# Patient Record
Sex: Female | Born: 1940 | Race: White | Hispanic: No | Smoking: Former smoker
Health system: Southern US, Community
[De-identification: ages and names within clinical notes are randomized; demographics above are authoritative.]

## PROBLEM LIST (undated history)

## (undated) DIAGNOSIS — E785 Hyperlipidemia, unspecified: Secondary | ICD-10-CM

## (undated) DIAGNOSIS — J449 Chronic obstructive pulmonary disease, unspecified: Secondary | ICD-10-CM

## (undated) DIAGNOSIS — I509 Heart failure, unspecified: Secondary | ICD-10-CM

## (undated) DIAGNOSIS — I10 Essential (primary) hypertension: Secondary | ICD-10-CM

## (undated) DIAGNOSIS — I219 Acute myocardial infarction, unspecified: Secondary | ICD-10-CM

## (undated) HISTORY — DX: Acute myocardial infarction, unspecified: I21.9

## (undated) HISTORY — PX: CHOLECYSTECTOMY: SHX55

## (undated) HISTORY — DX: Chronic obstructive pulmonary disease, unspecified: J44.9

## (undated) HISTORY — DX: Hyperlipidemia, unspecified: E78.5

## (undated) HISTORY — DX: Heart failure, unspecified: I50.9

## (undated) HISTORY — PX: EYE SURGERY: SHX253

## (undated) HISTORY — PX: ABDOMINAL HYSTERECTOMY: SHX81

## (undated) HISTORY — DX: Essential (primary) hypertension: I10

## (undated) HISTORY — PX: APPENDECTOMY: SHX54

## (undated) HISTORY — PX: CORONARY ARTERY BYPASS GRAFT: SHX141

---

## 2008-01-15 DIAGNOSIS — E785 Hyperlipidemia, unspecified: Secondary | ICD-10-CM | POA: Insufficient documentation

## 2008-01-15 DIAGNOSIS — I2510 Atherosclerotic heart disease of native coronary artery without angina pectoris: Secondary | ICD-10-CM | POA: Insufficient documentation

## 2012-01-10 DIAGNOSIS — Z955 Presence of coronary angioplasty implant and graft: Secondary | ICD-10-CM | POA: Insufficient documentation

## 2013-07-24 DIAGNOSIS — S61209A Unspecified open wound of unspecified finger without damage to nail, initial encounter: Secondary | ICD-10-CM | POA: Diagnosis not present

## 2013-07-26 DIAGNOSIS — E785 Hyperlipidemia, unspecified: Secondary | ICD-10-CM | POA: Diagnosis not present

## 2013-07-26 DIAGNOSIS — I252 Old myocardial infarction: Secondary | ICD-10-CM | POA: Diagnosis not present

## 2013-07-26 DIAGNOSIS — I509 Heart failure, unspecified: Secondary | ICD-10-CM | POA: Diagnosis not present

## 2013-07-26 DIAGNOSIS — I7090 Unspecified atherosclerosis: Secondary | ICD-10-CM | POA: Diagnosis not present

## 2013-07-26 DIAGNOSIS — I2510 Atherosclerotic heart disease of native coronary artery without angina pectoris: Secondary | ICD-10-CM | POA: Diagnosis not present

## 2013-07-26 DIAGNOSIS — I10 Essential (primary) hypertension: Secondary | ICD-10-CM | POA: Diagnosis not present

## 2013-07-26 DIAGNOSIS — N183 Chronic kidney disease, stage 3 unspecified: Secondary | ICD-10-CM | POA: Diagnosis not present

## 2013-07-26 DIAGNOSIS — J449 Chronic obstructive pulmonary disease, unspecified: Secondary | ICD-10-CM | POA: Diagnosis not present

## 2013-08-05 DIAGNOSIS — J329 Chronic sinusitis, unspecified: Secondary | ICD-10-CM | POA: Diagnosis not present

## 2013-08-05 DIAGNOSIS — J40 Bronchitis, not specified as acute or chronic: Secondary | ICD-10-CM | POA: Diagnosis not present

## 2013-08-05 DIAGNOSIS — H6120 Impacted cerumen, unspecified ear: Secondary | ICD-10-CM | POA: Diagnosis not present

## 2013-08-05 DIAGNOSIS — Z4802 Encounter for removal of sutures: Secondary | ICD-10-CM | POA: Diagnosis not present

## 2013-09-24 DIAGNOSIS — I509 Heart failure, unspecified: Secondary | ICD-10-CM | POA: Diagnosis not present

## 2013-09-24 DIAGNOSIS — E785 Hyperlipidemia, unspecified: Secondary | ICD-10-CM | POA: Diagnosis not present

## 2013-09-24 DIAGNOSIS — I129 Hypertensive chronic kidney disease with stage 1 through stage 4 chronic kidney disease, or unspecified chronic kidney disease: Secondary | ICD-10-CM | POA: Diagnosis not present

## 2013-09-24 DIAGNOSIS — J449 Chronic obstructive pulmonary disease, unspecified: Secondary | ICD-10-CM | POA: Diagnosis not present

## 2013-09-24 DIAGNOSIS — N183 Chronic kidney disease, stage 3 unspecified: Secondary | ICD-10-CM | POA: Diagnosis not present

## 2013-12-06 DIAGNOSIS — E785 Hyperlipidemia, unspecified: Secondary | ICD-10-CM | POA: Diagnosis not present

## 2013-12-18 DIAGNOSIS — Z1231 Encounter for screening mammogram for malignant neoplasm of breast: Secondary | ICD-10-CM | POA: Diagnosis not present

## 2013-12-24 DIAGNOSIS — G8929 Other chronic pain: Secondary | ICD-10-CM | POA: Diagnosis not present

## 2013-12-24 DIAGNOSIS — I10 Essential (primary) hypertension: Secondary | ICD-10-CM | POA: Diagnosis not present

## 2013-12-24 DIAGNOSIS — E7210 Disorders of sulfur-bearing amino-acid metabolism, unspecified: Secondary | ICD-10-CM | POA: Diagnosis not present

## 2013-12-24 DIAGNOSIS — E785 Hyperlipidemia, unspecified: Secondary | ICD-10-CM | POA: Diagnosis not present

## 2013-12-24 DIAGNOSIS — I509 Heart failure, unspecified: Secondary | ICD-10-CM | POA: Diagnosis not present

## 2013-12-24 DIAGNOSIS — M549 Dorsalgia, unspecified: Secondary | ICD-10-CM | POA: Diagnosis not present

## 2013-12-25 DIAGNOSIS — I2510 Atherosclerotic heart disease of native coronary artery without angina pectoris: Secondary | ICD-10-CM | POA: Diagnosis not present

## 2013-12-25 DIAGNOSIS — N183 Chronic kidney disease, stage 3 unspecified: Secondary | ICD-10-CM | POA: Diagnosis not present

## 2013-12-25 DIAGNOSIS — J449 Chronic obstructive pulmonary disease, unspecified: Secondary | ICD-10-CM | POA: Diagnosis not present

## 2013-12-25 DIAGNOSIS — E7210 Disorders of sulfur-bearing amino-acid metabolism, unspecified: Secondary | ICD-10-CM | POA: Diagnosis not present

## 2013-12-25 DIAGNOSIS — E785 Hyperlipidemia, unspecified: Secondary | ICD-10-CM | POA: Diagnosis not present

## 2013-12-30 DIAGNOSIS — R928 Other abnormal and inconclusive findings on diagnostic imaging of breast: Secondary | ICD-10-CM | POA: Diagnosis not present

## 2014-01-07 DIAGNOSIS — R920 Mammographic microcalcification found on diagnostic imaging of breast: Secondary | ICD-10-CM | POA: Diagnosis not present

## 2014-01-07 DIAGNOSIS — D249 Benign neoplasm of unspecified breast: Secondary | ICD-10-CM | POA: Diagnosis not present

## 2014-02-18 DIAGNOSIS — Z951 Presence of aortocoronary bypass graft: Secondary | ICD-10-CM | POA: Diagnosis not present

## 2014-02-18 DIAGNOSIS — I2510 Atherosclerotic heart disease of native coronary artery without angina pectoris: Secondary | ICD-10-CM | POA: Diagnosis not present

## 2014-02-18 DIAGNOSIS — E785 Hyperlipidemia, unspecified: Secondary | ICD-10-CM | POA: Diagnosis not present

## 2014-02-18 DIAGNOSIS — E789 Disorder of lipoprotein metabolism, unspecified: Secondary | ICD-10-CM | POA: Diagnosis not present

## 2014-02-18 DIAGNOSIS — I259 Chronic ischemic heart disease, unspecified: Secondary | ICD-10-CM | POA: Diagnosis not present

## 2014-02-18 DIAGNOSIS — Z9861 Coronary angioplasty status: Secondary | ICD-10-CM | POA: Diagnosis not present

## 2014-03-05 DIAGNOSIS — J3089 Other allergic rhinitis: Secondary | ICD-10-CM | POA: Diagnosis not present

## 2014-06-30 DIAGNOSIS — E785 Hyperlipidemia, unspecified: Secondary | ICD-10-CM | POA: Diagnosis not present

## 2014-06-30 DIAGNOSIS — I252 Old myocardial infarction: Secondary | ICD-10-CM | POA: Diagnosis not present

## 2014-06-30 DIAGNOSIS — I10 Essential (primary) hypertension: Secondary | ICD-10-CM | POA: Diagnosis not present

## 2014-06-30 DIAGNOSIS — I509 Heart failure, unspecified: Secondary | ICD-10-CM | POA: Diagnosis not present

## 2014-12-01 DIAGNOSIS — E780 Pure hypercholesterolemia: Secondary | ICD-10-CM | POA: Diagnosis not present

## 2014-12-01 DIAGNOSIS — S51851A Open bite of right forearm, initial encounter: Secondary | ICD-10-CM | POA: Diagnosis not present

## 2014-12-01 DIAGNOSIS — W540XXA Bitten by dog, initial encounter: Secondary | ICD-10-CM | POA: Diagnosis not present

## 2014-12-01 DIAGNOSIS — I10 Essential (primary) hypertension: Secondary | ICD-10-CM | POA: Diagnosis not present

## 2014-12-01 DIAGNOSIS — M8580 Other specified disorders of bone density and structure, unspecified site: Secondary | ICD-10-CM | POA: Diagnosis not present

## 2014-12-01 DIAGNOSIS — S61501A Unspecified open wound of right wrist, initial encounter: Secondary | ICD-10-CM | POA: Diagnosis not present

## 2014-12-01 DIAGNOSIS — I2510 Atherosclerotic heart disease of native coronary artery without angina pectoris: Secondary | ICD-10-CM | POA: Diagnosis not present

## 2014-12-05 DIAGNOSIS — R609 Edema, unspecified: Secondary | ICD-10-CM | POA: Diagnosis not present

## 2014-12-05 DIAGNOSIS — K30 Functional dyspepsia: Secondary | ICD-10-CM | POA: Diagnosis not present

## 2014-12-05 DIAGNOSIS — E785 Hyperlipidemia, unspecified: Secondary | ICD-10-CM | POA: Diagnosis not present

## 2014-12-05 DIAGNOSIS — M1000 Idiopathic gout, unspecified site: Secondary | ICD-10-CM | POA: Diagnosis not present

## 2014-12-05 DIAGNOSIS — I2510 Atherosclerotic heart disease of native coronary artery without angina pectoris: Secondary | ICD-10-CM | POA: Diagnosis not present

## 2015-02-19 ENCOUNTER — Other Ambulatory Visit: Payer: Self-pay | Admitting: *Deleted

## 2015-02-19 MED ORDER — POTASSIUM CHLORIDE ER 20 MEQ PO TBCR: 20.0000 meq | EXTENDED_RELEASE_TABLET | Freq: Two times a day (BID) | ORAL | Status: DC

## 2015-02-24 DIAGNOSIS — R0789 Other chest pain: Secondary | ICD-10-CM | POA: Diagnosis not present

## 2015-02-24 DIAGNOSIS — I5030 Unspecified diastolic (congestive) heart failure: Secondary | ICD-10-CM | POA: Diagnosis not present

## 2015-02-24 DIAGNOSIS — Z951 Presence of aortocoronary bypass graft: Secondary | ICD-10-CM | POA: Diagnosis not present

## 2015-02-24 DIAGNOSIS — R0602 Shortness of breath: Secondary | ICD-10-CM | POA: Diagnosis not present

## 2015-02-24 DIAGNOSIS — Z7982 Long term (current) use of aspirin: Secondary | ICD-10-CM | POA: Diagnosis not present

## 2015-02-24 DIAGNOSIS — Z79899 Other long term (current) drug therapy: Secondary | ICD-10-CM | POA: Diagnosis not present

## 2015-02-24 DIAGNOSIS — R079 Chest pain, unspecified: Secondary | ICD-10-CM | POA: Diagnosis not present

## 2015-02-24 DIAGNOSIS — I259 Chronic ischemic heart disease, unspecified: Secondary | ICD-10-CM | POA: Diagnosis not present

## 2015-02-24 DIAGNOSIS — I2510 Atherosclerotic heart disease of native coronary artery without angina pectoris: Secondary | ICD-10-CM | POA: Diagnosis not present

## 2015-02-24 DIAGNOSIS — E785 Hyperlipidemia, unspecified: Secondary | ICD-10-CM | POA: Diagnosis not present

## 2015-02-24 DIAGNOSIS — Z955 Presence of coronary angioplasty implant and graft: Secondary | ICD-10-CM | POA: Diagnosis not present

## 2015-02-24 DIAGNOSIS — I749 Embolism and thrombosis of unspecified artery: Secondary | ICD-10-CM | POA: Diagnosis not present

## 2015-03-04 DIAGNOSIS — I272 Other secondary pulmonary hypertension: Secondary | ICD-10-CM | POA: Diagnosis not present

## 2015-03-04 DIAGNOSIS — I348 Other nonrheumatic mitral valve disorders: Secondary | ICD-10-CM | POA: Diagnosis not present

## 2015-03-04 DIAGNOSIS — I259 Chronic ischemic heart disease, unspecified: Secondary | ICD-10-CM | POA: Diagnosis not present

## 2015-03-04 DIAGNOSIS — R0602 Shortness of breath: Secondary | ICD-10-CM | POA: Diagnosis not present

## 2015-03-04 DIAGNOSIS — I350 Nonrheumatic aortic (valve) stenosis: Secondary | ICD-10-CM | POA: Diagnosis not present

## 2015-03-04 DIAGNOSIS — I082 Rheumatic disorders of both aortic and tricuspid valves: Secondary | ICD-10-CM | POA: Diagnosis not present

## 2015-03-04 DIAGNOSIS — Z955 Presence of coronary angioplasty implant and graft: Secondary | ICD-10-CM | POA: Diagnosis not present

## 2015-03-04 DIAGNOSIS — I517 Cardiomegaly: Secondary | ICD-10-CM | POA: Diagnosis not present

## 2015-03-04 DIAGNOSIS — R079 Chest pain, unspecified: Secondary | ICD-10-CM | POA: Diagnosis not present

## 2015-03-04 DIAGNOSIS — I361 Nonrheumatic tricuspid (valve) insufficiency: Secondary | ICD-10-CM | POA: Diagnosis not present

## 2015-03-04 DIAGNOSIS — Z951 Presence of aortocoronary bypass graft: Secondary | ICD-10-CM | POA: Diagnosis not present

## 2015-03-12 DIAGNOSIS — Z951 Presence of aortocoronary bypass graft: Secondary | ICD-10-CM | POA: Diagnosis not present

## 2015-03-12 DIAGNOSIS — I2510 Atherosclerotic heart disease of native coronary artery without angina pectoris: Secondary | ICD-10-CM | POA: Diagnosis not present

## 2015-03-12 DIAGNOSIS — Z955 Presence of coronary angioplasty implant and graft: Secondary | ICD-10-CM | POA: Diagnosis not present

## 2015-03-12 DIAGNOSIS — I259 Chronic ischemic heart disease, unspecified: Secondary | ICD-10-CM | POA: Diagnosis not present

## 2015-03-12 DIAGNOSIS — Z882 Allergy status to sulfonamides status: Secondary | ICD-10-CM | POA: Diagnosis not present

## 2015-03-12 DIAGNOSIS — Z880 Allergy status to penicillin: Secondary | ICD-10-CM | POA: Diagnosis not present

## 2015-03-12 DIAGNOSIS — E780 Pure hypercholesterolemia: Secondary | ICD-10-CM | POA: Diagnosis not present

## 2015-03-12 DIAGNOSIS — Z79899 Other long term (current) drug therapy: Secondary | ICD-10-CM | POA: Diagnosis not present

## 2015-03-12 DIAGNOSIS — I25118 Atherosclerotic heart disease of native coronary artery with other forms of angina pectoris: Secondary | ICD-10-CM | POA: Diagnosis not present

## 2015-03-20 ENCOUNTER — Ambulatory Visit (INDEPENDENT_AMBULATORY_CARE_PROVIDER_SITE_OTHER): Payer: Medicare Other | Admitting: Family Medicine

## 2015-03-20 ENCOUNTER — Other Ambulatory Visit: Payer: Self-pay | Admitting: Family Medicine

## 2015-03-20 ENCOUNTER — Encounter (INDEPENDENT_AMBULATORY_CARE_PROVIDER_SITE_OTHER): Payer: Self-pay

## 2015-03-20 ENCOUNTER — Ambulatory Visit (INDEPENDENT_AMBULATORY_CARE_PROVIDER_SITE_OTHER): Payer: Medicare Other

## 2015-03-20 ENCOUNTER — Encounter: Payer: Self-pay | Admitting: Family Medicine

## 2015-03-20 VITALS — BP 129/70 | HR 70 | Temp 97.1°F | Ht 61.0 in | Wt 224.6 lb

## 2015-03-20 DIAGNOSIS — R0602 Shortness of breath: Secondary | ICD-10-CM

## 2015-03-20 DIAGNOSIS — R0600 Dyspnea, unspecified: Secondary | ICD-10-CM | POA: Diagnosis not present

## 2015-03-20 DIAGNOSIS — I420 Dilated cardiomyopathy: Secondary | ICD-10-CM | POA: Diagnosis not present

## 2015-03-20 DIAGNOSIS — I209 Angina pectoris, unspecified: Secondary | ICD-10-CM | POA: Diagnosis not present

## 2015-03-20 DIAGNOSIS — J449 Chronic obstructive pulmonary disease, unspecified: Secondary | ICD-10-CM

## 2015-03-20 NOTE — Progress Notes (Addendum)
Subjective:  Patient ID: Alexis Powell, female    DOB: 1941-03-01  Age: 74 y.o. MRN: [id number]  CC: Hypertension; Hyperlipidemia; and Coronary Artery Disease   HPI [name] presents for dyspnea on exertion. Pulse ox readings at home drop as low as 77 with a single flight of stairs. She has been evaluated by cardiology and brings in an echocardiogram report showing aortic sclerosis and mild pulmonary insufficiency but no aortic stenosis or other significant valvular problem. Her ejection fraction is 50-55 percent. Additionally she had a cardiac catheterization that report was received as well. 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  Subsequently she began recording her pulse ox reading multiple times daily. That log was brought in and is attached. It shows that at rest her pulse ox tends to stay in the low 90s. However if she stirs such as to take a shower or walk around the house she quickly drops into the mid 80s. If she tries to climb a flight of stairs or something similar strenuous she will have a pulse ox dropped into the mid 70s. She does not have any significant edema. However after her cath last week she was given an IV dose of Lasix and dropped 9 pounds overnight. That has started to return she is now down 5 pounds from her baseline. However, she denies edema. She does not think this is her heart failure. However she states that since her dyspnea is only with exertion that it is not COPD. She feels that a lot of this started when her angina medicine, isosorbide was changed from one generic to another. She takes only brand name Coreg because of problems with the generic carvedilol and she only takes branded Altace due to problems with the generic ramipril. History Elantra has a past medical history of Hyperlipidemia; Hypertension; and Myocardial infarction.   She has past surgical history that includes Coronary artery bypass graft;  Abdominal hysterectomy; Cesarean section; Appendectomy; and Cholecystectomy.   Her family history includes Heart disease in her father.She reports that she quit smoking about 16 years ago. She does not have any smokeless tobacco history on file. She reports that she does not drink alcohol or use illicit drugs.  Outpatient Prescriptions Prior to Visit  Medication Sig Dispense Refill  . potassium chloride 20 MEQ TBCR Take 20 mEq by mouth 2 (two) times daily. 60 tablet 1   No facility-administered medications prior to visit.    ROS Review of Systems  Constitutional: Negative for fever, chills, diaphoresis, appetite change, fatigue and unexpected weight change.  HENT: Negative for congestion, ear pain, hearing loss, postnasal drip, rhinorrhea, sneezing, sore throat and trouble swallowing.   Eyes: Negative for pain.  Respiratory: Positive for cough, chest tightness and shortness of breath. Negative for wheezing.   Cardiovascular: Negative for chest pain, palpitations and leg swelling.  Gastrointestinal: Negative for nausea, vomiting, abdominal pain, diarrhea and constipation.  Genitourinary: Negative for dysuria, frequency and menstrual problem.  Musculoskeletal: Negative for joint swelling and arthralgias.  Skin: Negative for rash.  Neurological: Negative for dizziness, weakness, numbness and headaches.  Psychiatric/Behavioral: Negative for dysphoric mood and agitation.    Objective:  BP 129/70 mmHg  Pulse 70  Temp(Src) 97.1 F (36.2 C) (Oral)  Ht 5' 1"  (1.549 m)  Wt 224 lb 9.6 oz (101.878 kg)  BMI 42.46 kg/m2  SpO2 86%  BP Readings from Last 3 Encounters:  03/20/15 129/70    Wt Readings  from Last 3 Encounters:  03/20/15 224 lb 9.6 oz (101.878 kg)     Physical Exam  Constitutional: She is oriented to person, place, and time. She appears well-developed and well-nourished. No distress.  HENT:  Head: Normocephalic and atraumatic.  Right Ear: External ear normal.  Left Ear:  External ear normal.  Nose: Nose normal.  Mouth/Throat: Oropharynx is clear and moist.  Eyes: Conjunctivae and EOM are normal. Pupils are equal, round, and reactive to light.  Neck: Normal range of motion. Neck supple. No thyromegaly present.  Cardiovascular: Normal rate, regular rhythm and normal heart sounds.   No murmur heard. Pulmonary/Chest: She is in respiratory distress. She has wheezes. She has no rales.  Pulse ox 86 at rest. Down into 70s with ambulation  Abdominal: Soft. Bowel sounds are normal. She exhibits no distension. There is no tenderness.  Lymphadenopathy:    She has no cervical adenopathy.  Neurological: She is alert and oriented to person, place, and time. She has normal reflexes.  Skin: Skin is warm and dry.  Psychiatric: She has a normal mood and affect. Her behavior is normal. Judgment and thought content normal.    No results found for: HGBA1C  No results found for: WBC, HGB, HCT, PLT, GLUCOSE, CHOL, TRIG, HDL, LDLDIRECT, LDLCALC, ALT, AST, NA, K, CL, CREATININE, BUN, CO2, TSH, PSA, INR, GLUF, HGBA1C, MICROALBUR  Patient was never admitted.  Assessment & Plan:   Myya was seen today for hypertension, hyperlipidemia and coronary artery disease.  Diagnoses and all orders for this visit:  Congestive cardiomyopathy -     CBC with Differential/Platelet -     Pro b natriuretic peptide -     D-dimer, quantitative (not at [facility]) -     CMP14+EGFR -     Ambulatory referral to [place] pectoris -     CBC with Differential/Platelet -     Pro b natriuretic peptide -     D-dimer, quantitative (not at [facility]) -     CMP14+EGFR -     Ambulatory referral to [place]  COPD mixed type -     CBC with Differential/Platelet -     Pro b natriuretic peptide -     D-dimer, quantitative (not at [facility]) -     CMP14+EGFR -     Ambulatory referral to [place]  Dyspnea -     CBC with Differential/Platelet -     Pro b natriuretic peptide -     D-dimer,  quantitative (not at [facility]) -     CMP14+EGFR -     Ambulatory referral to [place]   I have discontinued Ms. Altieri's Potassium Chloride ER. I am also having her maintain her aspirin, atorvastatin, carvedilol, carvedilol, famotidine, furosemide, isosorbide mononitrate, ramipril, and potassium chloride.  Meds ordered this encounter  Medications  . aspirin 81 MG chewable tablet    Sig: Chew 81 mg by mouth daily.  Marland Kitchen atorvastatin (LIPITOR) 10 MG tablet    Sig: Take 10 mg by mouth daily.  . carvedilol (COREG) 12.5 MG tablet    Sig: Take 12.5 mg by mouth 2 (two) times daily.  . carvedilol (COREG) 6.25 MG tablet    Sig: Take 6.25 mg by mouth every morning.  . famotidine (PEPCID) 20 MG tablet    Sig: Take 20 mg by mouth daily.  . furosemide (LASIX) 40 MG tablet    Sig: Take 40 mg by mouth 2 (two) times daily.  . isosorbide mononitrate (IMDUR) 30  MG 24 hr tablet    Sig: Take 30 mg by mouth daily.  . ramipril (ALTACE) 10 MG capsule    Sig: Take 10 mg by mouth daily.  . potassium chloride (KLOR-CON) 20 MEQ packet    Sig: Take 20 mEq by mouth 2 (two) times daily.   Initial chest x-ray reading showed no acute cardiopulmonary disease. There was some hyperexpansion noted. This by Dr. Robb Matar Stacks. Subsequent reading by radiology added to that elevation of the right hemidiaphragm.  Pulmonary function testing showed severe restrictive disease. No obstruction noted.  Patient could not tolerate respiratory stress test today due to low pulse ox of 86 after performing the resting pulmonary function test.  Follow-up: Return in about 1 week (around 03/27/2015).  Claretta Fraise, M.D.

## 2015-03-21 LAB — CBC WITH DIFFERENTIAL/PLATELET
BASOS: 0 %
Basophils Absolute: 0 10*3/uL (ref 0.0–0.2)
EOS (ABSOLUTE): 0.1 10*3/uL (ref 0.0–0.4)
Eos: 2 %
Hematocrit: 48.4 % — ABNORMAL HIGH (ref 34.0–46.6)
Hemoglobin: 16 g/dL — ABNORMAL HIGH (ref 11.1–15.9)
IMMATURE GRANS (ABS): 0 10*3/uL (ref 0.0–0.1)
Immature Granulocytes: 0 %
LYMPHS ABS: 1.5 10*3/uL (ref 0.7–3.1)
Lymphs: 24 %
MCH: 31.9 pg (ref 26.6–33.0)
MCHC: 33.1 g/dL (ref 31.5–35.7)
MCV: 97 fL (ref 79–97)
MONOS ABS: 0.9 10*3/uL (ref 0.1–0.9)
Monocytes: 14 %
NEUTROS ABS: 3.7 10*3/uL (ref 1.4–7.0)
Neutrophils: 60 %
PLATELETS: 160 10*3/uL (ref 150–379)
RBC: 5.01 x10E6/uL (ref 3.77–5.28)
RDW: 13.4 % (ref 12.3–15.4)
WBC: 6.2 10*3/uL (ref 3.4–10.8)

## 2015-03-21 LAB — CMP14+EGFR
ALBUMIN: 4.2 g/dL (ref 3.5–4.8)
ALK PHOS: 82 IU/L (ref 39–117)
ALT: 12 IU/L (ref 0–32)
AST: 17 IU/L (ref 0–40)
Albumin/Globulin Ratio: 1.8 (ref 1.1–2.5)
BUN / CREAT RATIO: 16 (ref 11–26)
BUN: 14 mg/dL (ref 8–27)
Bilirubin Total: 0.5 mg/dL (ref 0.0–1.2)
CO2: 27 mmol/L (ref 18–29)
CREATININE: 0.86 mg/dL (ref 0.57–1.00)
Calcium: 9 mg/dL (ref 8.7–10.3)
Chloride: 98 mmol/L (ref 97–108)
GFR calc Af Amer: 76 mL/min/{1.73_m2} (ref >59)
GFR calc non Af Amer: 66 mL/min/{1.73_m2} (ref >59)
GLOBULIN, TOTAL: 2.4 g/dL (ref 1.5–4.5)
Glucose: 102 mg/dL — ABNORMAL HIGH (ref 65–99)
Potassium: 4.2 mmol/L (ref 3.5–5.2)
SODIUM: 145 mmol/L — AB (ref 134–144)
Total Protein: 6.6 g/dL (ref 6.0–8.5)

## 2015-03-21 LAB — PRO B NATRIURETIC PEPTIDE: NT-Pro BNP: 343 pg/mL (ref 0–738)

## 2015-03-21 LAB — D-DIMER, QUANTITATIVE: D-DIMER: 0.74 mg/L FEU — ABNORMAL HIGH (ref 0.00–0.49)

## 2015-03-25 ENCOUNTER — Encounter: Payer: Self-pay | Admitting: Family Medicine

## 2015-03-25 LAB — PULMONARY FUNCTION TEST

## 2015-03-27 ENCOUNTER — Telehealth: Payer: Self-pay | Admitting: Family Medicine

## 2015-03-27 ENCOUNTER — Encounter: Payer: Self-pay | Admitting: Family Medicine

## 2015-03-27 ENCOUNTER — Ambulatory Visit (INDEPENDENT_AMBULATORY_CARE_PROVIDER_SITE_OTHER): Payer: Medicare Other | Admitting: Family Medicine

## 2015-03-27 VITALS — BP 117/68 | HR 69 | Temp 97.2°F | Ht 61.0 in | Wt 223.8 lb

## 2015-03-27 DIAGNOSIS — I209 Angina pectoris, unspecified: Secondary | ICD-10-CM | POA: Diagnosis not present

## 2015-03-27 DIAGNOSIS — R0602 Shortness of breath: Secondary | ICD-10-CM | POA: Diagnosis not present

## 2015-03-27 MED ORDER — ISOSORBIDE MONONITRATE ER 30 MG PO TB24: 30.0000 mg | ORAL_TABLET | Freq: Every day | ORAL | Status: DC

## 2015-03-27 NOTE — Progress Notes (Signed)
Subjective:  Patient ID: Alexis Powell, female    DOB: 05/17/1941  Age: 74 y.o. MRN: [id number]  CC: Coronary Artery Disease and Congestive Heart Failure   HPI [name] presents for patient continues to feel weak. She is also short of breath. She has some edema of the legs. She is taking Lasix twice a day.. She can ambulate for activities of daily living in the home. However she is unable to perform any more strenuous activities. She becomes too short of breath. Her pulse ox will drop into the 70s or low 80s. She checks it regularly at home. She would like to have oxygen for home use based on the low pulse ox. She has not yet had her CTPA. That is to be done based on the mild elevation of her d-dimer at last visit 1 week ago.  History Deondrea has a past medical history of Hyperlipidemia; Hypertension; and Myocardial infarction.   She has past surgical history that includes Coronary artery bypass graft; Abdominal hysterectomy; Cesarean section; Appendectomy; and Cholecystectomy.   Her family history includes Heart disease in her father.She reports that she quit smoking about 16 years ago. She does not have any smokeless tobacco history on file. She reports that she does not drink alcohol or use illicit drugs.  Outpatient Prescriptions Prior to Visit  Medication Sig Dispense Refill  . aspirin 81 MG chewable tablet Chew 81 mg by mouth daily.    Marland Kitchen atorvastatin (LIPITOR) 10 MG tablet Take 10 mg by mouth daily.    . carvedilol (COREG) 6.25 MG tablet Take 6.25 mg by mouth every morning.    . famotidine (PEPCID) 20 MG tablet Take 20 mg by mouth daily.    . furosemide (LASIX) 40 MG tablet Take 40 mg by mouth 2 (two) times daily.    . potassium chloride (KLOR-CON) 20 MEQ packet Take 20 mEq by mouth 2 (two) times daily.    . ramipril (ALTACE) 10 MG capsule Take 10 mg by mouth daily.    . isosorbide mononitrate (IMDUR) 30 MG 24 hr tablet Take 30 mg by mouth daily.    . carvedilol (COREG) 12.5 MG  tablet Take 12.5 mg by mouth every evening.     No facility-administered medications prior to visit.    ROS Review of Systems  Constitutional: Negative for fever, chills, diaphoresis, appetite change, fatigue and unexpected weight change.  HENT: Negative for congestion, ear pain, hearing loss, postnasal drip, rhinorrhea, sneezing, sore throat and trouble swallowing.   Eyes: Negative for pain.  Respiratory: Positive for chest tightness and shortness of breath.   Cardiovascular: Negative for chest pain and palpitations.       She notes that her pulse is been in the low 50s.  Gastrointestinal: Negative for nausea, vomiting, abdominal pain, diarrhea and constipation.  Genitourinary: Negative for dysuria, frequency and menstrual problem.  Musculoskeletal: Negative for joint swelling and arthralgias.  Skin: Negative for rash.  Neurological: Negative for dizziness, weakness, numbness and headaches.  Psychiatric/Behavioral: Negative for agitation. The patient is nervous/anxious.     Objective:  BP 117/68 mmHg  Pulse 69  Temp(Src) 97.2 F (36.2 C) (Oral)  Ht 5\' 1"  (1.549 m)  Wt 223 lb 12.8 oz (101.515 kg)  BMI 42.31 kg/m2  BP Readings from Last 3 Encounters:  03/27/15 117/68  03/20/15 129/70    Wt Readings from Last 3 Encounters:  03/27/15 223 lb 12.8 oz (101.515 kg)  03/20/15 224 lb 9.6 oz (101.878 kg)     Physical  Exam  Constitutional: She is oriented to person, place, and time. She appears well-developed and well-nourished. She appears distressed.  HENT:  Head: Normocephalic and atraumatic.  Eyes: Conjunctivae are normal. Pupils are equal, round, and reactive to light.  Neck: Normal range of motion. Neck supple. No thyromegaly present.  Cardiovascular: Normal rate, regular rhythm and normal heart sounds.   No murmur heard. Pulmonary/Chest: She is in respiratory distress. She has wheezes. She has no rales. She exhibits no tenderness.  Abdominal: Soft. Bowel sounds are  normal. She exhibits no distension. There is no tenderness.  Musculoskeletal: Normal range of motion.  Lymphadenopathy:    She has no cervical adenopathy.  Neurological: She is alert and oriented to person, place, and time.  Skin: Skin is warm and dry.  Psychiatric: She has a normal mood and affect. Her behavior is normal. Judgment and thought content normal.    No results found for: HGBA1C  Lab Results  Component Value Date   WBC 6.2 03/20/2015   HCT 48.4* 03/20/2015   GLUCOSE 102* 03/20/2015   ALT 12 03/20/2015   AST 17 03/20/2015   NA 145* 03/20/2015   K 4.2 03/20/2015   CL 98 03/20/2015   CREATININE 0.86 03/20/2015   BUN 14 03/20/2015   CO2 27 03/20/2015    Patient was never admitted.  Assessment & Plan:   Kellie was seen today for coronary artery disease and congestive heart failure.  Diagnoses and all orders for this visit:  SOB (shortness of breath) -     CT Angio Chest PE W/Cm &/Or Wo Cm -     isosorbide mononitrate (IMDUR) 30 MG 24 hr tablet; Take 1 tablet (30 mg total) by mouth daily. -     For home use only DME oxygen   I have changed Ms. Katzman's isosorbide mononitrate. I am also having her maintain her aspirin, atorvastatin, carvedilol, carvedilol, famotidine, furosemide, ramipril, and potassium chloride.  Meds ordered this encounter  Medications  . isosorbide mononitrate (IMDUR) 30 MG 24 hr tablet    Sig: Take 1 tablet (30 mg total) by mouth daily.    Dispense:  90 tablet    Refill:  3     Follow-up: Return in about 1 week (around 04/03/2015).  Claretta Fraise, M.D.

## 2015-03-28 ENCOUNTER — Telehealth: Payer: Self-pay | Admitting: *Deleted

## 2015-03-28 DIAGNOSIS — Z951 Presence of aortocoronary bypass graft: Secondary | ICD-10-CM | POA: Diagnosis not present

## 2015-03-28 DIAGNOSIS — R0602 Shortness of breath: Secondary | ICD-10-CM | POA: Diagnosis not present

## 2015-03-28 NOTE — Telephone Encounter (Signed)
Debbie from [facility] called with results of CTPA- Negative for PE, no acute findings. Other findings-? COPD, pulmonary arterial hypertension and elevation of right hemi-diaphragm.

## 2015-03-30 NOTE — Telephone Encounter (Signed)
Pt notified of results Verbalizes understanding 

## 2015-03-30 NOTE — Telephone Encounter (Signed)
Tell patient that she does not have a pulmonary embolism. I am concerned that her right side diaphragm is elevated and that is almost certainly why she has more shortness of breath. I do not know of any treatment available for that. If she would like to see pulmonology please refer her. 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 Those are generally benign. I know of no implications for this, but further evaluation with a 24-hour urine for metanephrines would be helpful. Please see if she can pick that up from a [facility] site in [place]. Thanks, WS.

## 2015-04-02 NOTE — Progress Notes (Signed)
Subjective:  Patient ID: Alexis Powell, female    DOB: [birth year]  Age: 74 y.o. MRN: [id number]  CC: Shortness of Breath   HPI Tationna Fullard presents for recheck of shortness of breath due to COPD and congestive heart failure. She started using home oxygen this week. Feeling far better. She says that for the first time in the last month she was able to walk all the way from her car to the building today without having to stop to rest along the way. She says she even had to park little further away than usual. She is wearing her home oxygen tank. She is using it at all times. Pulse ox drops into the 70s when she tries to ambulate without it. She can occasionally keep her pulse ox up above 89 briefly when she is sitting still. She is taking her Lasix regularly 80 mg as 40 twice a day. She states that this is effective at keeping her swelling down. It does however cause her to have to urinate frequently so someday she'll just hold the medicine a bit to take 1 before she goes out inside the other one till she comes in so she is not urinating excessively while away from home. She does stay home most of the time but she does leave home for essential errands and an occasional pleasure trip locally. She has been able to get back to that usual routine this week as long as she uses her oxygen.  History Ferrell has a past medical history of Hyperlipidemia; Hypertension; and Myocardial infarction.   She has past surgical history that includes Coronary artery bypass graft; Abdominal hysterectomy; Cesarean section; Appendectomy; and Cholecystectomy.   Her family history includes Heart disease in her father.She reports that she quit smoking about 16 years ago. She does not have any smokeless tobacco history on file. She reports that she does not drink alcohol or use illicit drugs.  Outpatient Prescriptions Prior to Visit  Medication Sig Dispense Refill  . aspirin 81 MG chewable tablet Chew 81 mg by mouth daily.     . famotidine (PEPCID) 20 MG tablet Take 20 mg by mouth daily.    Marland Kitchen atorvastatin (LIPITOR) 10 MG tablet Take 10 mg by mouth daily.    . carvedilol (COREG) 12.5 MG tablet Take 12.5 mg by mouth every evening.    . carvedilol (COREG) 6.25 MG tablet Take 6.25 mg by mouth every morning.    . furosemide (LASIX) 40 MG tablet Take 40 mg by mouth 2 (two) times daily.    . isosorbide mononitrate (IMDUR) 30 MG 24 hr tablet Take 1 tablet (30 mg total) by mouth daily. 90 tablet 3  . potassium chloride (KLOR-CON) 20 MEQ packet Take 20 mEq by mouth 2 (two) times daily.    . ramipril (ALTACE) 10 MG capsule Take 10 mg by mouth daily.     No facility-administered medications prior to visit.    ROS Review of Systems  Constitutional: Negative for fever, chills, diaphoresis, appetite change, fatigue and unexpected weight change.  HENT: Negative for congestion, ear pain, hearing loss, postnasal drip, rhinorrhea, sneezing, sore throat and trouble swallowing.   Eyes: Negative for pain.  Respiratory: Positive for chest tightness and shortness of breath. Negative for cough.   Cardiovascular: Positive for palpitations and leg swelling. Negative for chest pain.  Gastrointestinal: Negative for nausea, vomiting, abdominal pain, diarrhea and constipation.  Genitourinary: Negative for dysuria, frequency and menstrual problem.  Musculoskeletal: Negative for joint swelling and arthralgias.  Skin: Negative for rash.  Neurological: Negative for dizziness, weakness, numbness and headaches.  Psychiatric/Behavioral: Negative for dysphoric mood and agitation.    Objective:  BP 136/68 mmHg  Pulse 68  Temp(Src) 97.6 F (36.4 C) (Oral)  Ht 5\' 1"  (1.549 m)  Wt 223 lb (101.152 kg)  BMI 42.16 kg/m2  SpO2 90%  BP Readings from Last 3 Encounters:  04/03/15 136/68  03/27/15 117/68  03/20/15 129/70    Wt Readings from Last 3 Encounters:  04/03/15 223 lb (101.152 kg)  03/27/15 223 lb 12.8 oz (101.515 kg)  03/20/15 224  lb 9.6 oz (101.878 kg)     Physical Exam  Constitutional: She is oriented to person, place, and time. She appears well-developed and well-nourished. No distress.  HENT:  Head: Normocephalic and atraumatic.  Right Ear: External ear normal.  Left Ear: External ear normal.  Nose: Nose normal.  Mouth/Throat: Oropharynx is clear and moist.  Eyes: Conjunctivae and EOM are normal. Pupils are equal, round, and reactive to light.  Neck: Normal range of motion. Neck supple. No thyromegaly present.  Cardiovascular: Normal rate, regular rhythm and normal heart sounds.   No murmur heard. Pulmonary/Chest: Effort normal and breath sounds normal. No respiratory distress. She has no wheezes. She has no rales.  Abdominal: Soft. Bowel sounds are normal. She exhibits no distension. There is no tenderness.  Lymphadenopathy:    She has no cervical adenopathy.  Neurological: She is alert and oriented to person, place, and time. She has normal reflexes.  Skin: Skin is warm and dry.  Psychiatric: She has a normal mood and affect. Her behavior is normal. Judgment and thought content normal.    No results found for: HGBA1C  Lab Results  Component Value Date   WBC 6.2 03/20/2015   HCT 48.4* 03/20/2015   GLUCOSE 102* 03/20/2015   ALT 12 03/20/2015   AST 17 03/20/2015   NA 145* 03/20/2015   K 4.2 03/20/2015   CL 98 03/20/2015   CREATININE 0.86 03/20/2015   BUN 14 03/20/2015   CO2 27 03/20/2015    Patient was never admitted.  Assessment & Plan:   Atalaya was seen today for shortness of breath.  Diagnoses and all orders for this visit:  COPD mixed type  Congestive cardiomyopathy  SOB (shortness of breath) -     isosorbide mononitrate (IMDUR) 30 MG 24 hr tablet; Take 1 tablet (30 mg total) by mouth daily.  Other orders -     carvedilol (COREG) 12.5 MG tablet; 1/2 each morning and one each evening -     ramipril (ALTACE) 10 MG capsule; Take 1 capsule (10 mg total) by mouth daily. -      potassium chloride SA (K-DUR,KLOR-CON) 20 MEQ tablet; Take 2 tablets (40 mEq total) by mouth daily. -     furosemide (LASIX) 80 MG tablet; Take 1 tablet (80 mg total) by mouth daily. -     atorvastatin (LIPITOR) 10 MG tablet; Take 1 tablet (10 mg total) by mouth daily.  I have discontinued Ms. Gildner's carvedilol and potassium chloride. I have also changed her carvedilol, ramipril, furosemide, and atorvastatin. Additionally, I am having her start on potassium chloride SA. Lastly, I am having her maintain her aspirin, famotidine, and isosorbide mononitrate.  Meds ordered this encounter  Medications  . carvedilol (COREG) 12.5 MG tablet    Sig: 1/2 each morning and one each evening    Dispense:  135 tablet    Refill:  3  . ramipril (ALTACE)  10 MG capsule    Sig: Take 1 capsule (10 mg total) by mouth daily.    Dispense:  90 capsule    Refill:  3  . potassium chloride SA (K-DUR,KLOR-CON) 20 MEQ tablet    Sig: Take 2 tablets (40 mEq total) by mouth daily.    Dispense:  180 tablet    Refill:  3  . furosemide (LASIX) 80 MG tablet    Sig: Take 1 tablet (80 mg total) by mouth daily.    Dispense:  90 tablet    Refill:  3  . isosorbide mononitrate (IMDUR) 30 MG 24 hr tablet    Sig: Take 1 tablet (30 mg total) by mouth daily.    Dispense:  90 tablet    Refill:  3  . atorvastatin (LIPITOR) 10 MG tablet    Sig: Take 1 tablet (10 mg total) by mouth daily.    Dispense:  90 tablet    Refill:  3     Follow-up: Return in about 6 weeks (around 05/15/2015) for COPD, CHF.  Claretta Fraise, M.D.

## 2015-04-03 ENCOUNTER — Ambulatory Visit (INDEPENDENT_AMBULATORY_CARE_PROVIDER_SITE_OTHER): Payer: Medicare Other | Admitting: Family Medicine

## 2015-04-03 ENCOUNTER — Encounter: Payer: Self-pay | Admitting: Family Medicine

## 2015-04-03 VITALS — BP 136/68 | HR 68 | Temp 97.6°F | Ht 61.0 in | Wt 223.0 lb

## 2015-04-03 DIAGNOSIS — I209 Angina pectoris, unspecified: Secondary | ICD-10-CM | POA: Diagnosis not present

## 2015-04-03 DIAGNOSIS — I420 Dilated cardiomyopathy: Secondary | ICD-10-CM | POA: Diagnosis not present

## 2015-04-03 DIAGNOSIS — J449 Chronic obstructive pulmonary disease, unspecified: Secondary | ICD-10-CM | POA: Diagnosis not present

## 2015-04-03 DIAGNOSIS — R0602 Shortness of breath: Secondary | ICD-10-CM

## 2015-04-03 MED ORDER — FUROSEMIDE 80 MG PO TABS: 80.0000 mg | ORAL_TABLET | Freq: Every day | ORAL | Status: DC

## 2015-04-03 MED ORDER — ISOSORBIDE MONONITRATE ER 30 MG PO TB24: 30.0000 mg | ORAL_TABLET | Freq: Every day | ORAL | Status: DC

## 2015-04-03 MED ORDER — RAMIPRIL 10 MG PO CAPS: 10.0000 mg | ORAL_CAPSULE | Freq: Every day | ORAL | Status: DC

## 2015-04-03 MED ORDER — CARVEDILOL 12.5 MG PO TABS: ORAL_TABLET | ORAL | Status: DC

## 2015-04-03 MED ORDER — ATORVASTATIN CALCIUM 10 MG PO TABS: 10.0000 mg | ORAL_TABLET | Freq: Every day | ORAL | Status: DC

## 2015-04-03 MED ORDER — POTASSIUM CHLORIDE CRYS ER 20 MEQ PO TBCR: 40.0000 meq | EXTENDED_RELEASE_TABLET | Freq: Every day | ORAL | Status: DC

## 2015-04-17 ENCOUNTER — Other Ambulatory Visit: Payer: Self-pay | Admitting: Family Medicine

## 2015-04-17 ENCOUNTER — Ambulatory Visit: Payer: Self-pay | Admitting: Family Medicine

## 2015-04-30 ENCOUNTER — Other Ambulatory Visit: Payer: Self-pay

## 2015-04-30 MED ORDER — FUROSEMIDE 80 MG PO TABS: 80.0000 mg | ORAL_TABLET | Freq: Every day | ORAL | Status: DC

## 2015-04-30 NOTE — Telephone Encounter (Signed)
Note forwarded to Dr. Livia Snellen

## 2015-05-05 ENCOUNTER — Other Ambulatory Visit: Payer: Self-pay | Admitting: *Deleted

## 2015-05-05 DIAGNOSIS — I10 Essential (primary) hypertension: Secondary | ICD-10-CM

## 2015-05-05 DIAGNOSIS — E876 Hypokalemia: Secondary | ICD-10-CM

## 2015-05-05 DIAGNOSIS — E785 Hyperlipidemia, unspecified: Secondary | ICD-10-CM

## 2015-05-11 ENCOUNTER — Other Ambulatory Visit: Payer: Self-pay | Admitting: *Deleted

## 2015-05-11 DIAGNOSIS — J449 Chronic obstructive pulmonary disease, unspecified: Secondary | ICD-10-CM | POA: Diagnosis not present

## 2015-05-11 DIAGNOSIS — E785 Hyperlipidemia, unspecified: Secondary | ICD-10-CM | POA: Diagnosis not present

## 2015-05-11 DIAGNOSIS — I420 Dilated cardiomyopathy: Secondary | ICD-10-CM

## 2015-05-11 DIAGNOSIS — I209 Angina pectoris, unspecified: Secondary | ICD-10-CM

## 2015-05-12 LAB — CMP14+EGFR
A/G RATIO: 2.1 (ref 1.1–2.5)
ALBUMIN: 4.2 g/dL (ref 3.5–4.8)
ALK PHOS: 80 IU/L (ref 39–117)
ALT: 9 IU/L (ref 0–32)
AST: 15 IU/L (ref 0–40)
BUN / CREAT RATIO: 21 (ref 11–26)
BUN: 19 mg/dL (ref 8–27)
Bilirubin Total: 0.7 mg/dL (ref 0.0–1.2)
CALCIUM: 9.1 mg/dL (ref 8.7–10.3)
CO2: 31 mmol/L — AB (ref 18–29)
CREATININE: 0.9 mg/dL (ref 0.57–1.00)
Chloride: 96 mmol/L — ABNORMAL LOW (ref 97–106)
GFR calc Af Amer: 72 mL/min/{1.73_m2} (ref >59)
GFR, EST NON AFRICAN AMERICAN: 62 mL/min/{1.73_m2} (ref >59)
GLOBULIN, TOTAL: 2 g/dL (ref 1.5–4.5)
Glucose: 102 mg/dL — ABNORMAL HIGH (ref 65–99)
POTASSIUM: 5.1 mmol/L (ref 3.5–5.2)
SODIUM: 139 mmol/L (ref 136–144)
Total Protein: 6.2 g/dL (ref 6.0–8.5)

## 2015-05-12 LAB — LIPID PANEL
CHOL/HDL RATIO: 2.7 ratio (ref 0.0–4.4)
Cholesterol, Total: 139 mg/dL (ref 100–199)
HDL: 51 mg/dL (ref >39)
LDL CALC: 70 mg/dL (ref 0–99)
Triglycerides: 90 mg/dL (ref 0–149)
VLDL Cholesterol Cal: 18 mg/dL (ref 5–40)

## 2015-05-15 ENCOUNTER — Ambulatory Visit: Payer: Medicare Other | Admitting: Family Medicine

## 2015-05-18 ENCOUNTER — Ambulatory Visit (INDEPENDENT_AMBULATORY_CARE_PROVIDER_SITE_OTHER): Payer: Medicare Other | Admitting: Family Medicine

## 2015-05-18 ENCOUNTER — Encounter: Payer: Self-pay | Admitting: Family Medicine

## 2015-05-18 VITALS — BP 125/63 | HR 62 | Temp 96.3°F | Ht 61.0 in | Wt 225.0 lb

## 2015-05-18 DIAGNOSIS — E785 Hyperlipidemia, unspecified: Secondary | ICD-10-CM

## 2015-05-18 DIAGNOSIS — J449 Chronic obstructive pulmonary disease, unspecified: Secondary | ICD-10-CM

## 2015-05-18 DIAGNOSIS — R0902 Hypoxemia: Secondary | ICD-10-CM

## 2015-05-18 DIAGNOSIS — I10 Essential (primary) hypertension: Secondary | ICD-10-CM

## 2015-05-18 DIAGNOSIS — I420 Dilated cardiomyopathy: Secondary | ICD-10-CM | POA: Diagnosis not present

## 2015-05-18 DIAGNOSIS — Q791 Other congenital malformations of diaphragm: Secondary | ICD-10-CM | POA: Diagnosis not present

## 2015-05-18 DIAGNOSIS — I209 Angina pectoris, unspecified: Secondary | ICD-10-CM | POA: Diagnosis not present

## 2015-05-18 NOTE — Progress Notes (Signed)
Subjective:  Patient ID: Alexis Powell, female    DOB: [birth year]  Age: 74 y.o. MRN: [id number]  CC: COPD   HPI [name] presents for follow-up of her COPD. She recently started using oxygen and she says she's never felt better. She is trying to get a portable concentrator that she can take place to place to give her more mobility. She is having some productive cough. But she denies any shortness of breath currently. She does not have any edema. She continues to use her furosemide for fluid management along with brand name Coreg and Altace to control her congestive heart failure symptoms. She denies any complications from that at this time. The Coreg also helps with her blood pressure control and keeps her from having exacerbations of her angina.  Patient in for follow-up of elevated cholesterol. Doing well without complaints on current medication. Denies side effects of statin including myalgia and arthralgia and nausea. Also in today for liver function testing. Currently no chest pain, shortness of breath or other cardiovascular related symptoms noted.   follow-up of hypertension. Patient has no history of headache chest pain or shortness of breath or recent cough. Patient also denies symptoms of TIA such as numbness weakness lateralizing. Patient checks  blood pressure at home and has not had any elevated readings recently. Patient denies side effects from his medication. States taking it regularly.   History Alexis Powell has a past medical history of Hyperlipidemia; Hypertension; and Myocardial infarction ([place]).   She has past surgical history that includes Coronary artery bypass graft; Abdominal hysterectomy; Cesarean section; Appendectomy; and Cholecystectomy.   Her family history includes Heart disease in her father.She reports that she quit smoking about 16 years ago. She does not have any smokeless tobacco history on file. She reports that she does not drink alcohol or use illicit  drugs.  Outpatient Prescriptions Prior to Visit  Medication Sig Dispense Refill  . aspirin 81 MG chewable tablet Chew 81 mg by mouth daily.    Marland Kitchen atorvastatin (LIPITOR) 10 MG tablet Take 1 tablet (10 mg total) by mouth daily. 90 tablet 3  . carvedilol (COREG) 12.5 MG tablet 1/2 each morning and one each evening 135 tablet 3  . famotidine (PEPCID) 20 MG tablet Take 20 mg by mouth daily.    . furosemide (LASIX) 80 MG tablet Take 1 tablet (80 mg total) by mouth daily. 90 tablet 1  . isosorbide mononitrate (IMDUR) 30 MG 24 hr tablet Take 1 tablet (30 mg total) by mouth daily. 90 tablet 3  . potassium chloride SA (K-DUR,KLOR-CON) 20 MEQ tablet TAKE 1 TABLET BY MOUTH TWICE DAILY 60 tablet 4  . ramipril (ALTACE) 10 MG capsule Take 1 capsule (10 mg total) by mouth daily. 90 capsule 3  . potassium chloride SA (K-DUR,KLOR-CON) 20 MEQ tablet Take 2 tablets (40 mEq total) by mouth daily. (Patient not taking: Reported on 05/18/2015) 180 tablet 3   No facility-administered medications prior to visit.    ROS Review of Systems  Constitutional: Negative for fever, activity change and appetite change.  HENT: Negative for congestion, rhinorrhea and sore throat.   Eyes: Negative for pain and visual disturbance.  Respiratory: Positive for cough. Negative for shortness of breath.   Gastrointestinal: Negative for nausea and abdominal pain.  Musculoskeletal: Negative for myalgias and arthralgias.    Objective:  BP 125/63 mmHg  Pulse 62  Temp(Src) 96.3 F (35.7 C) (Oral)  Ht 5\' 1"  (1.549 m)  Wt 225 lb (102.059 kg)  BMI 42.54 kg/m2  SpO2 96%  BP Readings from Last 3 Encounters:  05/18/15 125/63  04/03/15 136/68  03/27/15 117/68    Wt Readings from Last 3 Encounters:  05/18/15 225 lb (102.059 kg)  04/03/15 223 lb (101.152 kg)  03/27/15 223 lb 12.8 oz (101.515 kg)     Physical Exam  Constitutional: She is oriented to person, place, and time. She appears well-developed and well-nourished. No  distress.  HENT:  Head: Normocephalic and atraumatic.  Eyes: Conjunctivae are normal. Pupils are equal, round, and reactive to light.  Neck: Normal range of motion. Neck supple. No thyromegaly present.  Cardiovascular: Normal rate, regular rhythm and normal heart sounds.   No murmur heard. Pulmonary/Chest: Effort normal. No respiratory distress. She has no wheezes. She has no rales.  Breath sounds are distant with some minimal scattered rhonchi no rales.  Abdominal: Soft. Bowel sounds are normal. She exhibits no distension. There is no tenderness.  Musculoskeletal: Normal range of motion.  Lymphadenopathy:    She has no cervical adenopathy.  Neurological: She is alert and oriented to person, place, and time.  Skin: Skin is warm and dry.  Psychiatric: She has a normal mood and affect. Her behavior is normal. Judgment and thought content normal.    No results found for: HGBA1C  Lab Results  Component Value Date   WBC 6.2 03/20/2015   HCT 48.4* 03/20/2015   GLUCOSE 102* 05/11/2015   CHOL 139 05/11/2015   TRIG 90 05/11/2015   HDL 51 05/11/2015   LDLCALC 70 05/11/2015   ALT 9 05/11/2015   AST 15 05/11/2015   NA 139 05/11/2015   K 5.1 05/11/2015   CL 96* 05/11/2015   CREATININE 0.90 05/11/2015   BUN 19 05/11/2015   CO2 31* 05/11/2015    Patient was never admitted.  Assessment & Plan:   Solomia was seen today for copd.  Diagnoses and all orders for this visit:  COPD with hypoxia ([place]) -     For home use only DME oxygen -     For home use only DME oxygen  Diaphragmatic eventration  HLD (hyperlipidemia)  Essential hypertension  Angina pectoris ([place])  Congestive cardiomyopathy ([place])   I am having Ms. Arcia maintain her aspirin, famotidine, carvedilol, ramipril, isosorbide mononitrate, atorvastatin, potassium chloride SA, and furosemide.  No orders of the defined types were placed in this encounter.   Pt. Declines DEXA. Planning mammogram soon.  Follow-up:  Return in about 6 months (around 11/15/2015) for CPE.  Claretta Fraise, M.D.

## 2015-05-20 ENCOUNTER — Telehealth: Payer: Self-pay | Admitting: Family Medicine

## 2015-05-20 MED ORDER — LEVOFLOXACIN 500 MG PO TABS: 500.0000 mg | ORAL_TABLET | Freq: Every day | ORAL | Status: DC

## 2015-05-20 NOTE — Telephone Encounter (Signed)
levaquin sent

## 2015-05-20 NOTE — Telephone Encounter (Signed)
Pt notified or [id number]

## 2015-05-20 NOTE — Telephone Encounter (Signed)
Was pt supposed to get antibiotic Please advise

## 2015-05-22 ENCOUNTER — Telehealth: Payer: Self-pay | Admitting: Family Medicine

## 2015-05-22 MED ORDER — DOXYCYCLINE HYCLATE 100 MG PO TABS: 100.0000 mg | ORAL_TABLET | Freq: Two times a day (BID) | ORAL | Status: DC

## 2015-05-22 NOTE — Telephone Encounter (Signed)
Patient aware that new rx has been sent to pharmacy.

## 2015-05-22 NOTE — Telephone Encounter (Signed)
Patient started Levaquin on Wednesday and she woke up around 3 this am with a rash and itching. She has been advised not to take antibiotic and she taken a benadryl. Please advise about a new antibiotic.

## 2015-05-22 NOTE — Telephone Encounter (Signed)
Doxycycline scrip sent. Tell pt.

## 2015-05-23 DIAGNOSIS — Q791 Other congenital malformations of diaphragm: Secondary | ICD-10-CM | POA: Insufficient documentation

## 2015-05-23 DIAGNOSIS — I10 Essential (primary) hypertension: Secondary | ICD-10-CM | POA: Insufficient documentation

## 2015-05-26 ENCOUNTER — Encounter: Payer: Self-pay | Admitting: *Deleted

## 2015-06-09 DIAGNOSIS — Z951 Presence of aortocoronary bypass graft: Secondary | ICD-10-CM | POA: Diagnosis not present

## 2015-06-09 DIAGNOSIS — Z955 Presence of coronary angioplasty implant and graft: Secondary | ICD-10-CM | POA: Diagnosis not present

## 2015-06-09 DIAGNOSIS — I259 Chronic ischemic heart disease, unspecified: Secondary | ICD-10-CM | POA: Diagnosis not present

## 2015-07-30 ENCOUNTER — Telehealth: Payer: Self-pay | Admitting: Family Medicine

## 2015-07-30 ENCOUNTER — Ambulatory Visit: Payer: Medicare Other | Admitting: Family Medicine

## 2015-07-30 NOTE — Telephone Encounter (Signed)
What? i dont understand. She has been on atorvastatin

## 2015-07-31 ENCOUNTER — Encounter: Payer: Self-pay | Admitting: Family Medicine

## 2015-08-03 ENCOUNTER — Ambulatory Visit (INDEPENDENT_AMBULATORY_CARE_PROVIDER_SITE_OTHER): Payer: Medicare Other | Admitting: Family Medicine

## 2015-08-03 ENCOUNTER — Encounter: Payer: Self-pay | Admitting: Family Medicine

## 2015-08-03 DIAGNOSIS — Q791 Other congenital malformations of diaphragm: Secondary | ICD-10-CM

## 2015-08-03 DIAGNOSIS — I420 Dilated cardiomyopathy: Secondary | ICD-10-CM

## 2015-08-03 DIAGNOSIS — I10 Essential (primary) hypertension: Secondary | ICD-10-CM | POA: Diagnosis not present

## 2015-08-03 DIAGNOSIS — J449 Chronic obstructive pulmonary disease, unspecified: Secondary | ICD-10-CM | POA: Diagnosis not present

## 2015-08-03 MED ORDER — AZITHROMYCIN 250 MG PO TABS: ORAL_TABLET | ORAL | Status: DC

## 2015-08-03 MED ORDER — FUROSEMIDE 40 MG PO TABS: 80.0000 mg | ORAL_TABLET | Freq: Every day | ORAL | Status: DC
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
## 2015-08-04 NOTE — Telephone Encounter (Signed)
Pt was seen on 08/03/15

## 2015-08-05 ENCOUNTER — Telehealth: Payer: Self-pay | Admitting: Family Medicine

## 2015-08-05 NOTE — Telephone Encounter (Signed)
FYI Pt took 2 Zithromax pills yesterday woke up last PM with tongue tingling This AM tongue was swollen Pt d/ced Z pac and is taking Benadryl Feels some better Does not want to take antibiotic at this time Stated if you wanted to see her back on Friday she will come in

## 2015-08-05 NOTE — Telephone Encounter (Signed)
If she is having symptoms of cough, wheeze, fever, or dyspnea I would like to see her.

## 2015-08-05 NOTE — Telephone Encounter (Signed)
Called pt and notified of Dr Artemio Aly recommendation

## 2015-08-24 ENCOUNTER — Telehealth: Payer: Self-pay | Admitting: Family Medicine

## 2015-08-24 NOTE — Telephone Encounter (Signed)
Stp and she states she needs brand name lipitor not the generic as she is having reactions to it. Asked pt what type of reaction she was having but pt only states she knows when she is reacting to something as she always does. Advised pt we would need documentation and specific details to get prior auth for Brand name Lipitor and pt said nevermind.

## 2015-09-20 ENCOUNTER — Other Ambulatory Visit: Payer: Self-pay | Admitting: Family Medicine

## 2015-09-24 ENCOUNTER — Telehealth: Payer: Self-pay | Admitting: Family Medicine

## 2015-09-24 NOTE — Telephone Encounter (Signed)
Pt only wants to talk to [facility].

## 2015-09-25 NOTE — Telephone Encounter (Signed)
Pt needed prior authorization for Lipitor 10mg  Authorization obtained from [employer] [facility] Pt notified, verbalizes understanding

## 2015-11-12 ENCOUNTER — Telehealth: Payer: Self-pay | Admitting: Family Medicine

## 2015-11-12 ENCOUNTER — Other Ambulatory Visit: Payer: Self-pay | Admitting: *Deleted

## 2015-11-12 DIAGNOSIS — I10 Essential (primary) hypertension: Secondary | ICD-10-CM

## 2015-11-12 DIAGNOSIS — E785 Hyperlipidemia, unspecified: Secondary | ICD-10-CM

## 2015-11-17 ENCOUNTER — Encounter: Payer: Medicare Other | Admitting: Family Medicine

## 2015-11-17 ENCOUNTER — Ambulatory Visit: Payer: Medicare Other | Admitting: Family Medicine

## 2015-11-18 ENCOUNTER — Encounter: Payer: Medicare Other | Admitting: Family Medicine

## 2015-12-01 ENCOUNTER — Ambulatory Visit (INDEPENDENT_AMBULATORY_CARE_PROVIDER_SITE_OTHER): Payer: Medicare Other | Admitting: Family Medicine

## 2015-12-01 ENCOUNTER — Encounter: Payer: Self-pay | Admitting: Family Medicine

## 2015-12-01 VITALS — BP 94/43 | HR 60 | Temp 97.5°F | Ht 61.0 in | Wt 221.0 lb

## 2015-12-01 DIAGNOSIS — I420 Dilated cardiomyopathy: Secondary | ICD-10-CM

## 2015-12-01 DIAGNOSIS — I10 Essential (primary) hypertension: Secondary | ICD-10-CM | POA: Diagnosis not present

## 2015-12-01 DIAGNOSIS — J449 Chronic obstructive pulmonary disease, unspecified: Secondary | ICD-10-CM

## 2015-12-01 DIAGNOSIS — E785 Hyperlipidemia, unspecified: Secondary | ICD-10-CM | POA: Diagnosis not present

## 2015-12-01 DIAGNOSIS — R238 Other skin changes: Secondary | ICD-10-CM

## 2015-12-01 DIAGNOSIS — E876 Hypokalemia: Secondary | ICD-10-CM | POA: Diagnosis not present

## 2015-12-01 DIAGNOSIS — R233 Spontaneous ecchymoses: Secondary | ICD-10-CM

## 2015-12-01 NOTE — Progress Notes (Signed)
 Subjective:  Patient ID: Alexis Powell, female    DOB: 09/12/1940  Age: 75 y.o. MRN: [id number]  CC: Annual Exam and Bleeding/Bruising   HPI Gowri Decoteau presents for follow up of COPD & CHF. She tends to have her oxygenation dropped into the 60s without her oxygen. She has to have the BiPAP. Meanwhile as long as she uses her oxygen at 3 L she can ambulate slowly around her home without difficulty. Has some swelling, but had to hold her lasix today due to the drive to the office (Approx 1 hour.) Will take when she gets home. It has been working well. Today she notes that she has had multiple bruises popping up. No excessive bleeding. No epistaxis. No vaginal or rectal bleeding. Denies chest pain.   History Samaya has a past medical history of Hyperlipidemia; Hypertension; and Myocardial infarction ([facility]).   She has past surgical history that includes Coronary artery bypass graft; Abdominal hysterectomy; Cesarean section; Appendectomy; and Cholecystectomy.   Her family history includes Heart disease in her father.She reports that she quit smoking about 16 years ago. She has never used smokeless tobacco. She reports that she does not drink alcohol or use illicit drugs.    ROS Review of Systems  Constitutional: Negative for fever, activity change and appetite change.  HENT: Negative for congestion, rhinorrhea and sore throat.   Eyes: Negative for visual disturbance.  Respiratory: Positive for shortness of breath (stable, but if off O2 gets dyspneic and gasping after a few minutes.). Negative for cough, wheezing and stridor.   Cardiovascular: Negative for chest pain and palpitations.  Gastrointestinal: Negative for nausea, abdominal pain and diarrhea.  Genitourinary: Negative for dysuria.  Musculoskeletal: Positive for neck pain (intermittent X 6 weeks. Started with a little red nodule whah has since resolved). Negative for myalgias and arthralgias.    Objective:  BP 94/43 mmHg  Pulse  60  Temp(Src) 97.5 F (36.4 C) (Oral)  Ht 5' 1" (1.549 m)  Wt 221 lb (100.245 kg)  BMI 41.78 kg/m2  SpO2 97%  BP Readings from Last 3 Encounters:  12/01/15 94/43  08/03/15 127/64  05/18/15 125/63    Wt Readings from Last 3 Encounters:  12/01/15 221 lb (100.245 kg)  08/03/15 226 lb (102.513 kg)  05/18/15 225 lb (102.059 kg)     Physical Exam  Constitutional: She is oriented to person, place, and time. She appears well-developed and well-nourished. No distress.  HENT:  Head: Normocephalic and atraumatic.  Right Ear: External ear normal.  Left Ear: External ear normal.  Nose: Nose normal.  Mouth/Throat: Oropharynx is clear and moist.  Eyes: Conjunctivae and EOM are normal. Pupils are equal, round, and reactive to light.  Neck: Normal range of motion. Neck supple. No thyromegaly present.  Cardiovascular: Normal rate and regular rhythm.  Exam reveals no gallop and no friction rub.   Murmur heard. Pulmonary/Chest: Effort normal. No respiratory distress. She has no wheezes. She has no rales.  1:1 resp. Ratio. (Decreased exp. Phase)   Abdominal: Soft. Bowel sounds are normal. She exhibits no distension. There is no tenderness.  Musculoskeletal: Normal range of motion. She exhibits tenderness (at left SCM origin).  Lymphadenopathy:    She has no cervical adenopathy.  Neurological: She is alert and oriented to person, place, and time. She has normal reflexes.  Skin: Skin is warm and dry.  Psychiatric: She has a normal mood and affect. Her behavior is normal. Judgment and thought content normal.     Lab Results    Component Value Date   WBC 6.2 03/20/2015   HCT 48.4* 03/20/2015   PLT 160 03/20/2015   GLUCOSE 102* 05/11/2015   CHOL 139 05/11/2015   TRIG 90 05/11/2015   HDL 51 05/11/2015   LDLCALC 70 05/11/2015   ALT 9 05/11/2015   AST 15 05/11/2015   NA 139 05/11/2015   K 5.1 05/11/2015   CL 96* 05/11/2015   CREATININE 0.90 05/11/2015   BUN 19 05/11/2015   CO2 31*  05/11/2015    Patient was never admitted.  Assessment & Plan:   Taffie was seen today for annual exam and bleeding/bruising.  Diagnoses and all orders for this visit:  Easy bruising -     Factor 5 assay  Essential hypertension -     CBC with Differential/Platelet -     CMP14+EGFR  Hyperlipemia -     CMP14+EGFR -     Lipid panel  Hypokalemia -     CMP14+EGFR  Congestive cardiomyopathy ([facility])  COPD mixed type ([facility])  HLD (hyperlipidemia)      I have discontinued Ms. Sciuto's azithromycin. I am also having her maintain her aspirin, famotidine, carvedilol, ramipril, isosorbide mononitrate, atorvastatin, potassium chloride SA, and furosemide.  No orders of the defined types were placed in this encounter.     Follow-up: Return in about 3 months (around 03/02/2016).  Warren Stacks, M.D.  

## 2015-12-02 LAB — CBC WITH DIFFERENTIAL/PLATELET
BASOS: 0 %
Basophils Absolute: 0 10*3/uL (ref 0.0–0.2)
EOS (ABSOLUTE): 0.1 10*3/uL (ref 0.0–0.4)
EOS: 1 %
HEMATOCRIT: 37.4 % (ref 34.0–46.6)
Hemoglobin: 11.9 g/dL (ref 11.1–15.9)
Immature Grans (Abs): 0 10*3/uL (ref 0.0–0.1)
Immature Granulocytes: 0 %
LYMPHS ABS: 1.2 10*3/uL (ref 0.7–3.1)
Lymphs: 25 %
MCH: 30.7 pg (ref 26.6–33.0)
MCHC: 31.8 g/dL (ref 31.5–35.7)
MCV: 97 fL (ref 79–97)
MONOS ABS: 0.5 10*3/uL (ref 0.1–0.9)
Monocytes: 10 %
Neutrophils Absolute: 3.1 10*3/uL (ref 1.4–7.0)
Neutrophils: 64 %
Platelets: 153 10*3/uL (ref 150–379)
RBC: 3.87 x10E6/uL (ref 3.77–5.28)
RDW: 12.7 % (ref 12.3–15.4)
WBC: 4.9 10*3/uL (ref 3.4–10.8)

## 2015-12-02 LAB — LIPID PANEL
CHOL/HDL RATIO: 2.5 ratio (ref 0.0–4.4)
Cholesterol, Total: 128 mg/dL (ref 100–199)
HDL: 52 mg/dL (ref >39)
LDL Calculated: 58 mg/dL (ref 0–99)
Triglycerides: 91 mg/dL (ref 0–149)
VLDL Cholesterol Cal: 18 mg/dL (ref 5–40)

## 2015-12-02 LAB — FACTOR 5 ASSAY: FACTOR V ACTIVITY: 84 % (ref 70–150)

## 2016-03-02 ENCOUNTER — Ambulatory Visit (INDEPENDENT_AMBULATORY_CARE_PROVIDER_SITE_OTHER): Payer: Medicare Other | Admitting: Family Medicine

## 2016-03-02 ENCOUNTER — Encounter: Payer: Self-pay | Admitting: Family Medicine

## 2016-03-02 VITALS — BP 100/61 | HR 61 | Temp 97.1°F | Ht 61.0 in | Wt 221.0 lb

## 2016-03-02 DIAGNOSIS — I10 Essential (primary) hypertension: Secondary | ICD-10-CM | POA: Diagnosis not present

## 2016-03-02 DIAGNOSIS — I420 Dilated cardiomyopathy: Secondary | ICD-10-CM

## 2016-03-02 DIAGNOSIS — J449 Chronic obstructive pulmonary disease, unspecified: Secondary | ICD-10-CM

## 2016-03-02 DIAGNOSIS — I209 Angina pectoris, unspecified: Secondary | ICD-10-CM

## 2016-03-02 DIAGNOSIS — E785 Hyperlipidemia, unspecified: Secondary | ICD-10-CM | POA: Diagnosis not present

## 2016-03-08 NOTE — Progress Notes (Signed)
Subjective:  Patient ID: Alexis Powell, female    DOB: July 18, 1940  Age: 75 y.o. MRN: [id number]  CC: Hypertension (3 mth rck); Hyperlipidemia; and COPD   HPI Alexis Powell presents for No recent exacerbations of her COPD or CHF. She continues medications as noted below.   follow-up of hypertension. Patient has no history of headache chest pain or shortness of breath or recent cough. Patient also denies symptoms of TIA such as numbness weakness lateralizing. Patient checks  blood pressure at home and has not had any elevated readings recently. Patient denies side effects from his medication. States taking it regularly. Patient in for follow-up of elevated cholesterol. Doing well without complaints on current medication. Denies side effects of statin including myalgia and arthralgia and nausea. Also in today for liver function testing. Currently no chest pain, shortness of breath or other cardiovascular related symptoms noted.  She still uses oxygen at night for her COPD. She is at baseline for her dyspnea, she is able to do ADLs and perform activities requiring minimal to mild exertion only. No overt wheezing. She also denies swelling.   History Masen has a past medical history of Hyperlipidemia; Hypertension; and Myocardial infarction ([place]).   She has a past surgical history that includes Coronary artery bypass graft; Abdominal hysterectomy; Cesarean section; Appendectomy; and Cholecystectomy.   Her family history includes Heart disease in her father.She reports that she quit smoking about 16 years ago. She has never used smokeless tobacco. She reports that she does not drink alcohol or use drugs.    ROS Review of Systems  Constitutional: Negative for activity change, appetite change and fever.  HENT: Negative for congestion, rhinorrhea and sore throat.   Eyes: Negative for visual disturbance.  Respiratory: Negative for cough and shortness of breath.   Cardiovascular: Negative for  chest pain and palpitations.  Gastrointestinal: Negative for abdominal pain, diarrhea and nausea.  Genitourinary: Negative for dysuria.  Musculoskeletal: Negative for arthralgias and myalgias.    Objective:  BP 100/61 (BP Location: Left Arm, Patient Position: Sitting, Cuff Size: Large)   Pulse 61   Temp 97.1 F (36.2 C) (Oral)   Ht 5\' 1"  (1.549 m)   Wt 221 lb (100.2 kg)   SpO2 (!) 77% Comment: room air with exertion  BMI 41.76 kg/m   BP Readings from Last 3 Encounters:  03/02/16 100/61  12/01/15 (!) 94/43  08/03/15 127/64    Wt Readings from Last 3 Encounters:  03/02/16 221 lb (100.2 kg)  12/01/15 221 lb (100.2 kg)  08/03/15 226 lb (102.5 kg)     Physical Exam  Constitutional: She is oriented to person, place, and time. She appears well-developed and well-nourished. No distress.  HENT:  Head: Normocephalic and atraumatic.  Right Ear: External ear normal.  Left Ear: External ear normal.  Nose: Nose normal.  Mouth/Throat: Oropharynx is clear and moist.  Eyes: Conjunctivae and EOM are normal. Pupils are equal, round, and reactive to light.  Neck: Normal range of motion. Neck supple. No thyromegaly present.  Cardiovascular: Normal rate, regular rhythm and normal heart sounds.   No murmur heard. Pulmonary/Chest: Effort normal and breath sounds normal. No respiratory distress. She has no wheezes. She has no rales.  Abdominal: Soft. Bowel sounds are normal. She exhibits no distension. There is no tenderness.  Lymphadenopathy:    She has no cervical adenopathy.  Neurological: She is alert and oriented to person, place, and time. She has normal reflexes.  Skin: Skin is warm and dry.  Psychiatric: She has a normal mood and affect. Her behavior is normal. Judgment and thought content normal.     Lab Results  Component Value Date   WBC 4.9 12/01/2015   HCT 37.4 12/01/2015   PLT 153 12/01/2015   GLUCOSE 102 (H) 05/11/2015   CHOL 128 12/01/2015   TRIG 91 12/01/2015   HDL  52 12/01/2015   LDLCALC 58 12/01/2015   ALT 9 05/11/2015   AST 15 05/11/2015   NA 139 05/11/2015   K 5.1 05/11/2015   CL 96 (L) 05/11/2015   CREATININE 0.90 05/11/2015   BUN 19 05/11/2015   CO2 31 (H) 05/11/2015     Assessment & Plan:   [name] was seen today for hypertension, hyperlipidemia and copd.  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  No change of meds today.   Follow-up: Return in about 3 months (around 06/02/2016).  Claretta Fraise, M.D.

## 2016-03-21 ENCOUNTER — Other Ambulatory Visit: Payer: Self-pay | Admitting: Family Medicine

## 2016-03-21 DIAGNOSIS — R0602 Shortness of breath: Secondary | ICD-10-CM

## 2016-04-22 ENCOUNTER — Other Ambulatory Visit: Payer: Self-pay | Admitting: Family Medicine

## 2016-04-28 ENCOUNTER — Other Ambulatory Visit: Payer: Self-pay | Admitting: Family Medicine

## 2016-05-17 ENCOUNTER — Other Ambulatory Visit: Payer: Self-pay | Admitting: Family Medicine

## 2016-05-31 DIAGNOSIS — I358 Other nonrheumatic aortic valve disorders: Secondary | ICD-10-CM | POA: Diagnosis not present

## 2016-05-31 DIAGNOSIS — I2510 Atherosclerotic heart disease of native coronary artery without angina pectoris: Secondary | ICD-10-CM | POA: Diagnosis not present

## 2016-05-31 DIAGNOSIS — Z7982 Long term (current) use of aspirin: Secondary | ICD-10-CM | POA: Diagnosis not present

## 2016-05-31 DIAGNOSIS — I5032 Chronic diastolic (congestive) heart failure: Secondary | ICD-10-CM | POA: Diagnosis not present

## 2016-05-31 DIAGNOSIS — Z955 Presence of coronary angioplasty implant and graft: Secondary | ICD-10-CM | POA: Diagnosis not present

## 2016-05-31 DIAGNOSIS — E785 Hyperlipidemia, unspecified: Secondary | ICD-10-CM | POA: Diagnosis not present

## 2016-05-31 DIAGNOSIS — Z9981 Dependence on supplemental oxygen: Secondary | ICD-10-CM | POA: Diagnosis not present

## 2016-05-31 DIAGNOSIS — Z951 Presence of aortocoronary bypass graft: Secondary | ICD-10-CM | POA: Diagnosis not present

## 2016-05-31 DIAGNOSIS — I071 Rheumatic tricuspid insufficiency: Secondary | ICD-10-CM | POA: Diagnosis not present

## 2016-05-31 DIAGNOSIS — J449 Chronic obstructive pulmonary disease, unspecified: Secondary | ICD-10-CM | POA: Diagnosis not present

## 2016-06-06 ENCOUNTER — Ambulatory Visit: Payer: Medicare Other | Admitting: Family Medicine

## 2016-06-17 ENCOUNTER — Ambulatory Visit: Payer: Medicare Other | Admitting: Family Medicine

## 2016-07-05 ENCOUNTER — Ambulatory Visit: Payer: Medicare Other | Admitting: Family Medicine

## 2016-07-20 ENCOUNTER — Ambulatory Visit: Payer: Medicare Other | Admitting: Family Medicine

## 2016-07-26 ENCOUNTER — Telehealth: Payer: Self-pay | Admitting: Family Medicine

## 2016-07-26 DIAGNOSIS — J209 Acute bronchitis, unspecified: Secondary | ICD-10-CM | POA: Diagnosis not present

## 2016-07-26 DIAGNOSIS — R5081 Fever presenting with conditions classified elsewhere: Secondary | ICD-10-CM | POA: Diagnosis not present

## 2016-07-26 DIAGNOSIS — Z79899 Other long term (current) drug therapy: Secondary | ICD-10-CM | POA: Diagnosis not present

## 2016-07-26 DIAGNOSIS — J449 Chronic obstructive pulmonary disease, unspecified: Secondary | ICD-10-CM | POA: Diagnosis not present

## 2016-07-27 ENCOUNTER — Other Ambulatory Visit: Payer: Self-pay | Admitting: Family Medicine

## 2016-07-27 ENCOUNTER — Ambulatory Visit: Payer: Medicare Other | Admitting: Family Medicine

## 2016-07-27 NOTE — Telephone Encounter (Signed)
Patient states she wants to talk to Dr. Livia Snellen about personal information. Would not give me any details. Please advise.

## 2016-07-27 NOTE — Telephone Encounter (Signed)
I spoke to the patient. She's been having cough and congestion. She wears oxygen for her COPD. She lives out of town and has had a hard time making it here for an appointment specifically with the weather problems that we've had. Because of that she went to a local urgent care yesterday. She was given a cough syrup and levofloxacin. She wanted to run by me whether she should take these or not. I approved him over the phone with her. She should follow up here when she finishes the course of medicine. The office for an appointment.

## 2016-07-29 ENCOUNTER — Other Ambulatory Visit: Payer: Self-pay | Admitting: Family Medicine

## 2016-08-08 ENCOUNTER — Other Ambulatory Visit: Payer: Self-pay | Admitting: Family Medicine

## 2016-08-09 ENCOUNTER — Ambulatory Visit (INDEPENDENT_AMBULATORY_CARE_PROVIDER_SITE_OTHER): Payer: Medicare Other

## 2016-08-09 ENCOUNTER — Ambulatory Visit (INDEPENDENT_AMBULATORY_CARE_PROVIDER_SITE_OTHER): Payer: Medicare Other | Admitting: Family Medicine

## 2016-08-09 ENCOUNTER — Encounter: Payer: Self-pay | Admitting: Family Medicine

## 2016-08-09 VITALS — BP 90/60 | HR 67 | Temp 96.7°F | Ht 61.0 in | Wt 214.0 lb

## 2016-08-09 DIAGNOSIS — R05 Cough: Secondary | ICD-10-CM | POA: Diagnosis not present

## 2016-08-09 DIAGNOSIS — R059 Cough, unspecified: Secondary | ICD-10-CM

## 2016-08-09 LAB — VERITOR FLU A/B WAIVED
INFLUENZA A: NEGATIVE
INFLUENZA B: NEGATIVE

## 2016-08-09 MED ORDER — AZITHROMYCIN 250 MG PO TABS: ORAL_TABLET | ORAL | 0 refills | Status: DC

## 2016-08-09 MED ORDER — METHYLPREDNISOLONE ACETATE 80 MG/ML IJ SUSP
80.0000 mg | Freq: Once | INTRAMUSCULAR | Status: AC
  Administered 2016-08-09: 80 mg via INTRAMUSCULAR

## 2016-08-09 NOTE — Progress Notes (Signed)
Subjective:  Patient ID: Alexis Powell, female    DOB: December 24, 1940  Age: 76 y.o. MRN: [id number]  CC: Cough (pt here today c/o cough, congestion and just feels bad. She was diagnosed with the flu 3 weeks ago.)   HPI Alexis Powell presents for continued cough and malaise for 3 weeks. Couldn't tolerate the levoquin due to myalgia and arthralgia. Denies fever. Feeling listless, staying in bed all day. Denies edema. Dx was with the flu. No flu test done & pt. Given Abx at urgent care.   History Alexis Powell has a past medical history of Hyperlipidemia; Hypertension; and Myocardial infarction.   She has a past surgical history that includes Coronary artery bypass graft; Abdominal hysterectomy; Cesarean section; Appendectomy; and Cholecystectomy.   Her family history includes Heart disease in her father.She reports that she quit smoking about 17 years ago. She has never used smokeless tobacco. She reports that she does not drink alcohol or use drugs.    ROS Review of Systems  Constitutional: Positive for fatigue. Negative for activity change, appetite change and fever.  HENT: Negative for congestion, rhinorrhea and sore throat.   Eyes: Negative for visual disturbance.  Respiratory: Positive for cough and shortness of breath (equivocal).   Cardiovascular: Negative for chest pain and palpitations.  Gastrointestinal: Negative for abdominal pain, diarrhea and nausea.  Musculoskeletal: Positive for arthralgias and myalgias.    Objective:  BP 90/60   Pulse 67   Temp (!) 96.7 F (35.9 C) (Oral)   Ht _0  (1.549 m)   Wt 214 lb (97.1 kg)   SpO2 100% Comment: on 3 liters of oxygen  BMI 40.43 kg/m   BP Readings from Last 3 Encounters:  08/09/16 90/60  03/02/16 100/61  12/01/15 (!) 94/43    Wt Readings from Last 3 Encounters:  08/09/16 214 lb (97.1 kg)  03/02/16 221 lb (100.2 kg)  12/01/15 221 lb (100.2 kg)     Physical Exam  Constitutional: She is oriented to person, place, and  time. She appears well-developed and well-nourished. No distress.  HENT:  Head: Normocephalic and atraumatic.  Right Ear: External ear normal.  Left Ear: External ear normal.  Nose: Nose normal.  Mouth/Throat: Oropharynx is clear and moist.  Eyes: Conjunctivae and EOM are normal. Pupils are equal, round, and reactive to light.  Neck: Normal range of motion. Neck supple. No thyromegaly present.  Cardiovascular: Normal rate, regular rhythm and normal heart sounds.   No murmur heard. Pulmonary/Chest: Effort normal and breath sounds normal. No respiratory distress. She has no wheezes. She has no rales.  Abdominal: Soft. Bowel sounds are normal. She exhibits no distension. There is no tenderness.  Lymphadenopathy:    She has no cervical adenopathy.  Neurological: She is alert and oriented to person, place, and time. She has normal reflexes.  Skin: Skin is warm and dry.  Psychiatric: She has a normal mood and affect. Her behavior is normal. Judgment and thought content normal.    Patient was never admitted.  Assessment & Plan:   Alexis Powell was seen today for cough.  Diagnoses and all orders for this visit:  Cough -     DG Chest 2 View; Future -     CBC with Differential/Platelet -     CMP14+EGFR -     Veritor Flu A/B Waived -     methylPREDNISolone acetate (DEPO-MEDROL) injection 80 mg; Inject 1 mL (80 mg total) into the muscle once.  Other orders -     azithromycin [facility]  Z-PAK) 250 MG tablet; Take two right away Then one a day for the next 4 days.    I am having Alexis Powell start on azithromycin. I am also having her maintain her aspirin, famotidine, isosorbide mononitrate, atorvastatin, and potassium chloride SA. We administered methylPREDNISolone acetate.  Allergies as of 08/09/2016      Reactions   Morphine And Related Anaphylaxis   Penicillins Anaphylaxis   Phenol Anaphylaxis   Sulfa Antibiotics Rash   Iodine-131 Rash   IVP dye      Medication List       Accurate  as of 08/09/16  9:44 PM. Always use your most recent med list.          ALTACE 10 MG capsule Generic drug:  ramipril take 1 capsule by mouth once daily   aspirin 81 MG chewable tablet Chew 81 mg by mouth daily.   atorvastatin 10 MG tablet Commonly known as:  LIPITOR Take 1 tablet (10 mg total) by mouth daily.   azithromycin 250 MG tablet Commonly known as:  ZITHROMAX Z-PAK Take two right away Then one a day for the next 4 days.   COREG 12.5 MG tablet Generic drug:  carvedilol take 1/2 tablet by mouth every morning and 1 tablet by mouth every evening   famotidine 20 MG tablet Commonly known as:  PEPCID Take 20 mg by mouth 2 (two) times daily.   furosemide 40 MG tablet Commonly known as:  LASIX take 2 tablets by mouth once daily   isosorbide mononitrate 30 MG 24 hr tablet Commonly known as:  IMDUR Take 1 tablet (30 mg total) by mouth daily.   isosorbide mononitrate 30 MG 24 hr tablet Commonly known as:  IMDUR take 1 tablet by mouth once daily   potassium chloride SA 20 MEQ tablet Commonly known as:  K-DUR,KLOR-CON TAKE 1 TABLET BY MOUTH TWICE DAILY   potassium chloride SA 20 MEQ tablet Commonly known as:  K-DUR,KLOR-CON take 2 tablets by mouth once daily        Follow-up: Return in about 3 months (around 11/06/2016), or if symptoms worsen or fail to improve.  Claretta Fraise, M.D.

## 2016-08-10 LAB — CMP14+EGFR
ALT: 11 IU/L (ref 0–32)
AST: 18 IU/L (ref 0–40)
Albumin/Globulin Ratio: 1.7 (ref 1.2–2.2)
Albumin: 4 g/dL (ref 3.5–4.8)
Alkaline Phosphatase: 70 IU/L (ref 39–117)
BILIRUBIN TOTAL: 0.5 mg/dL (ref 0.0–1.2)
BUN/Creatinine Ratio: 18 (ref 12–28)
BUN: 17 mg/dL (ref 8–27)
CHLORIDE: 94 mmol/L — AB (ref 96–106)
CO2: 28 mmol/L (ref 18–29)
Calcium: 9 mg/dL (ref 8.7–10.3)
Creatinine, Ser: 0.96 mg/dL (ref 0.57–1.00)
GFR calc non Af Amer: 57 mL/min/{1.73_m2} — ABNORMAL LOW (ref >59)
GFR, EST AFRICAN AMERICAN: 66 mL/min/{1.73_m2} (ref >59)
GLUCOSE: 103 mg/dL — AB (ref 65–99)
Globulin, Total: 2.4 g/dL (ref 1.5–4.5)
Potassium: 5 mmol/L (ref 3.5–5.2)
Sodium: 141 mmol/L (ref 134–144)
TOTAL PROTEIN: 6.4 g/dL (ref 6.0–8.5)

## 2016-08-10 LAB — CBC WITH DIFFERENTIAL/PLATELET
BASOS ABS: 0 10*3/uL (ref 0.0–0.2)
Basos: 0 %
EOS (ABSOLUTE): 0.1 10*3/uL (ref 0.0–0.4)
Eos: 1 %
Hematocrit: 38.4 % (ref 34.0–46.6)
Hemoglobin: 12.7 g/dL (ref 11.1–15.9)
IMMATURE GRANS (ABS): 0 10*3/uL (ref 0.0–0.1)
IMMATURE GRANULOCYTES: 0 %
LYMPHS: 28 %
Lymphocytes Absolute: 2.1 10*3/uL (ref 0.7–3.1)
MCH: 31.6 pg (ref 26.6–33.0)
MCHC: 33.1 g/dL (ref 31.5–35.7)
MCV: 96 fL (ref 79–97)
Monocytes Absolute: 0.7 10*3/uL (ref 0.1–0.9)
Monocytes: 10 %
NEUTROS PCT: 61 %
Neutrophils Absolute: 4.4 10*3/uL (ref 1.4–7.0)
PLATELETS: 143 10*3/uL — AB (ref 150–379)
RBC: 4.02 x10E6/uL (ref 3.77–5.28)
RDW: 12.8 % (ref 12.3–15.4)
WBC: 7.3 10*3/uL (ref 3.4–10.8)

## 2016-08-11 ENCOUNTER — Other Ambulatory Visit: Payer: Self-pay | Admitting: Family Medicine

## 2016-09-02 ENCOUNTER — Encounter: Payer: Self-pay | Admitting: Family Medicine

## 2016-09-02 ENCOUNTER — Ambulatory Visit (INDEPENDENT_AMBULATORY_CARE_PROVIDER_SITE_OTHER): Payer: Medicare Other | Admitting: Family Medicine

## 2016-09-02 VITALS — BP 101/61 | HR 60 | Temp 96.8°F | Ht 61.0 in | Wt 215.0 lb

## 2016-09-02 DIAGNOSIS — I420 Dilated cardiomyopathy: Secondary | ICD-10-CM

## 2016-09-02 DIAGNOSIS — J449 Chronic obstructive pulmonary disease, unspecified: Secondary | ICD-10-CM | POA: Diagnosis not present

## 2016-09-02 DIAGNOSIS — R600 Localized edema: Secondary | ICD-10-CM

## 2016-09-02 DIAGNOSIS — I10 Essential (primary) hypertension: Secondary | ICD-10-CM

## 2016-09-02 DIAGNOSIS — E782 Mixed hyperlipidemia: Secondary | ICD-10-CM
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
## 2016-09-25 ENCOUNTER — Other Ambulatory Visit: Payer: Self-pay | Admitting: Family Medicine

## 2016-09-25 DIAGNOSIS — R0602 Shortness of breath: Secondary | ICD-10-CM

## 2016-09-28 DIAGNOSIS — J449 Chronic obstructive pulmonary disease, unspecified: Secondary | ICD-10-CM | POA: Diagnosis not present

## 2016-09-28 DIAGNOSIS — R0602 Shortness of breath: Secondary | ICD-10-CM | POA: Diagnosis not present

## 2016-09-28 DIAGNOSIS — R0902 Hypoxemia: Secondary | ICD-10-CM | POA: Diagnosis not present

## 2016-10-17 DIAGNOSIS — I428 Other cardiomyopathies: Secondary | ICD-10-CM | POA: Diagnosis not present

## 2016-10-17 DIAGNOSIS — E785 Hyperlipidemia, unspecified: Secondary | ICD-10-CM | POA: Diagnosis not present

## 2016-10-17 DIAGNOSIS — Z951 Presence of aortocoronary bypass graft: Secondary | ICD-10-CM | POA: Diagnosis not present

## 2016-10-17 DIAGNOSIS — I5020 Unspecified systolic (congestive) heart failure: Secondary | ICD-10-CM | POA: Diagnosis not present

## 2016-10-17 DIAGNOSIS — I2510 Atherosclerotic heart disease of native coronary artery without angina pectoris: Secondary | ICD-10-CM | POA: Diagnosis not present

## 2016-10-29 ENCOUNTER — Other Ambulatory Visit: Payer: Self-pay | Admitting: Family Medicine

## 2016-11-09 DIAGNOSIS — I517 Cardiomegaly: Secondary | ICD-10-CM | POA: Diagnosis not present

## 2016-11-09 DIAGNOSIS — I083 Combined rheumatic disorders of mitral, aortic and tricuspid valves: Secondary | ICD-10-CM | POA: Diagnosis not present

## 2016-11-09 DIAGNOSIS — I519 Heart disease, unspecified: Secondary | ICD-10-CM | POA: Diagnosis not present

## 2016-12-05 ENCOUNTER — Ambulatory Visit (INDEPENDENT_AMBULATORY_CARE_PROVIDER_SITE_OTHER): Payer: Medicare Other | Admitting: Family Medicine

## 2016-12-05 ENCOUNTER — Encounter: Payer: Self-pay | Admitting: Family Medicine

## 2016-12-05 VITALS — BP 96/53 | HR 55 | Ht 61.0 in | Wt 220.0 lb

## 2016-12-05 DIAGNOSIS — J449 Chronic obstructive pulmonary disease, unspecified: Secondary | ICD-10-CM | POA: Diagnosis not present

## 2016-12-05 DIAGNOSIS — I420 Dilated cardiomyopathy: Secondary | ICD-10-CM

## 2016-12-05 DIAGNOSIS — E782 Mixed hyperlipidemia: Secondary | ICD-10-CM | POA: Diagnosis not present

## 2016-12-05 DIAGNOSIS — I10 Essential (primary) hypertension: Secondary | ICD-10-CM

## 2016-12-05 NOTE — Progress Notes (Signed)
Subjective:  Patient ID: Alexis Powell, female    DOB: [birth year]  Age: 76 y.o. MRN: [id number]  CC: COPD (pt here today for routine follow of chronic medical conditions)   HPI Alexis Powell presents for COPD follow-up. Also congestive heart failure. She has now made connections with Dr. Nani Gasser of [facility] for her COPD care. She is also seeing Dr. Dina Rich for her cardiac care.  Patient in for follow-up of elevated cholesterol. She stopped her atorvastatin about a month ago due to muscle and joint pains. She is trying to do it one month out of 3. Currently no chest pain, shortness of breath or other cardiovascular related symptoms noted.   follow-up of hypertension. Patient has no history of headache chest pain or shortness of breath or recent cough. Patient also denies symptoms of TIA such as numbness weakness lateralizing. Patient checks  blood pressure at home and has not had any elevated readings recently. Patient denies side effects from his medication. States taking it regularly.    History Alexis Powell has a past medical history of Hyperlipidemia; Hypertension; and Myocardial infarction ([place]).   She has a past surgical history that includes Coronary artery bypass graft; Abdominal hysterectomy; Cesarean section; Appendectomy; and Cholecystectomy.   Her family history includes Heart disease in her father.She reports that she quit smoking about 17 years ago. She has never used smokeless tobacco. She reports that she does not drink alcohol or use drugs.    ROS Review of Systems  Constitutional: Negative for activity change, appetite change and fever.  HENT: Negative for congestion, rhinorrhea and sore throat.   Eyes: Negative for visual disturbance.  Respiratory: Negative for cough and shortness of breath.   Cardiovascular: Negative for chest pain and palpitations.  Gastrointestinal: Negative for abdominal pain, diarrhea and nausea.  Genitourinary: Negative for dysuria.    Musculoskeletal: Negative for arthralgias and myalgias.    Objective:  BP (!) 96/53   Pulse (!) 55   Ht _0  (1.549 m)   Wt 220 lb (99.8 kg)   BMI 41.57 kg/m   BP Readings from Last 3 Encounters:  12/05/16 (!) 96/53  09/02/16 101/61  08/09/16 90/60    Wt Readings from Last 3 Encounters:  12/05/16 220 lb (99.8 kg)  09/02/16 215 lb (97.5 kg)  08/09/16 214 lb (97.1 kg)     Physical Exam  Constitutional: She is oriented to person, place, and time. She appears well-developed and well-nourished. No distress.  HENT:  Head: Normocephalic and atraumatic.  Right Ear: External ear normal.  Left Ear: External ear normal.  Nose: Nose normal.  Mouth/Throat: Oropharynx is clear and moist.  Eyes: Conjunctivae and EOM are normal. Pupils are equal, round, and reactive to light.  Neck: Normal range of motion. Neck supple. No thyromegaly present.  Cardiovascular: Normal rate, regular rhythm and normal heart sounds.   No murmur heard. Pulmonary/Chest: Effort normal and breath sounds normal. No respiratory distress. She has no wheezes. She has no rales.  Abdominal: Soft. Bowel sounds are normal. She exhibits no distension. There is no tenderness.  Lymphadenopathy:    She has no cervical adenopathy.  Neurological: She is alert and oriented to person, place, and time. She has normal reflexes.  Skin: Skin is warm and dry.  Psychiatric: She has a normal mood and affect. Her behavior is normal. Judgment and thought content normal.      Assessment & Plan:   Alexis Powell was seen today for copd.  Diagnoses and all orders  for this visit:  COPD mixed type ([place]) -     CBC with Differential/Platelet; Future -     CMP14+EGFR; Future  Congestive cardiomyopathy ([facility]) -     CBC with Differential/Platelet; Future -     CMP14+EGFR; Future -     Urinalysis; Future  Mixed hyperlipidemia -     CBC with Differential/Platelet; Future -     CMP14+EGFR; Future -     Lipid panel; Future  Essential  hypertension -     CBC with Differential/Platelet; Future -     CMP14+EGFR; Future -     Urinalysis; Future    Patient is stable, doing well. She has specialists in the main areas for major medical need. At this time we discussed that she may need to find a primary care physician closer to her home. She is concerned about her ability to travel when she is ill particularly considering her shortness of breath.   I am having [name] maintain her aspirin, famotidine, atorvastatin, COREG, isosorbide mononitrate, furosemide, ALTACE, and potassium chloride SA.  Allergies as of 12/05/2016      Reactions   Morphine And Related Anaphylaxis   Penicillins Anaphylaxis   Phenol Anaphylaxis   Sulfa Antibiotics Rash   Iodine-131 Rash   IVP dye      Medication List       Accurate as of 12/05/16  5:58 PM. Always use your most recent med list.          ALTACE 10 MG capsule Generic drug:  ramipril take 1 capsule by mouth once daily   aspirin 81 MG chewable tablet Chew 81 mg by mouth daily.   atorvastatin 10 MG tablet Commonly known as:  LIPITOR Take 1 tablet (10 mg total) by mouth daily.   COREG 12.5 MG tablet Generic drug:  carvedilol take 1/2 tablet by mouth every morning and 1 tablet by mouth every evening   famotidine 20 MG tablet Commonly known as:  PEPCID Take 20 mg by mouth 2 (two) times daily.   furosemide 40 MG tablet Commonly known as:  LASIX take 2 tablets by mouth once daily   isosorbide mononitrate 30 MG 24 hr tablet Commonly known as:  IMDUR take 1 tablet by mouth once daily   potassium chloride SA 20 MEQ tablet Commonly known as:  K-DUR,KLOR-CON take 2 tablets by mouth once daily        Follow-up: Return in about 6 months (around 06/06/2017).  Claretta Fraise, M.D.

## 2016-12-28 DIAGNOSIS — J449 Chronic obstructive pulmonary disease, unspecified: Secondary | ICD-10-CM | POA: Diagnosis not present

## 2016-12-28 DIAGNOSIS — I2510 Atherosclerotic heart disease of native coronary artery without angina pectoris: Secondary | ICD-10-CM | POA: Diagnosis not present

## 2016-12-28 DIAGNOSIS — R0902 Hypoxemia: Secondary | ICD-10-CM | POA: Diagnosis not present

## 2016-12-31 ENCOUNTER — Other Ambulatory Visit: Payer: Self-pay | Admitting: Family Medicine

## 2017-01-02 ENCOUNTER — Telehealth: Payer: Self-pay | Admitting: Family Medicine

## 2017-01-02 NOTE — Telephone Encounter (Signed)
Spoke with pharmacist and gave approval for 90 day supply with no refill of patient's K-Dur

## 2017-02-22 ENCOUNTER — Other Ambulatory Visit: Payer: Self-pay | Admitting: Family Medicine

## 2017-03-03 ENCOUNTER — Other Ambulatory Visit: Payer: Self-pay | Admitting: Family Medicine

## 2017-03-30 DIAGNOSIS — R0902 Hypoxemia: Secondary | ICD-10-CM | POA: Diagnosis not present

## 2017-03-30 DIAGNOSIS — J449 Chronic obstructive pulmonary disease, unspecified: Secondary | ICD-10-CM | POA: Diagnosis not present

## 2017-03-30 DIAGNOSIS — I2510 Atherosclerotic heart disease of native coronary artery without angina pectoris: Secondary | ICD-10-CM | POA: Diagnosis not present

## 2017-03-30 DIAGNOSIS — J438 Other emphysema: Secondary | ICD-10-CM | POA: Diagnosis not present

## 2017-04-05 ENCOUNTER — Other Ambulatory Visit: Payer: Self-pay | Admitting: Family Medicine

## 2017-05-02 ENCOUNTER — Other Ambulatory Visit: Payer: Self-pay | Admitting: Family Medicine

## 2017-05-03 ENCOUNTER — Telehealth: Payer: Self-pay | Admitting: Family Medicine

## 2017-05-03 NOTE — Telephone Encounter (Signed)
These were faxed on 05/02/17 / lat

## 2017-05-23 ENCOUNTER — Other Ambulatory Visit: Payer: Self-pay | Admitting: Family Medicine

## 2017-06-06 ENCOUNTER — Ambulatory Visit: Payer: Medicare Other | Admitting: Family Medicine

## 2017-06-28 ENCOUNTER — Other Ambulatory Visit: Payer: Self-pay | Admitting: Family Medicine

## 2017-06-28 NOTE — Telephone Encounter (Signed)
Last seen 12/05/16  Dr Livia Snellen

## 2017-07-28 ENCOUNTER — Other Ambulatory Visit: Payer: Self-pay | Admitting: Family Medicine

## 2017-07-28 NOTE — Telephone Encounter (Signed)
Last seen 12/05/16

## 2017-07-31 ENCOUNTER — Ambulatory Visit: Payer: Medicare Other | Admitting: Family Medicine

## 2017-08-08 ENCOUNTER — Ambulatory Visit (INDEPENDENT_AMBULATORY_CARE_PROVIDER_SITE_OTHER): Payer: Medicare Other | Admitting: Family Medicine

## 2017-08-08 ENCOUNTER — Encounter: Payer: Self-pay | Admitting: Family Medicine

## 2017-08-08 VITALS — BP 127/63 | HR 58 | Temp 96.6°F | Ht 61.0 in | Wt 221.0 lb

## 2017-08-08 DIAGNOSIS — I420 Dilated cardiomyopathy: Secondary | ICD-10-CM | POA: Diagnosis not present

## 2017-08-08 DIAGNOSIS — E559 Vitamin D deficiency, unspecified: Secondary | ICD-10-CM

## 2017-08-08 DIAGNOSIS — E782 Mixed hyperlipidemia: Secondary | ICD-10-CM | POA: Diagnosis not present

## 2017-08-08 DIAGNOSIS — J449 Chronic obstructive pulmonary disease, unspecified: Secondary | ICD-10-CM

## 2017-08-08 DIAGNOSIS — R399 Unspecified symptoms and signs involving the genitourinary system: Secondary | ICD-10-CM | POA: Diagnosis not present

## 2017-08-08 DIAGNOSIS — I10 Essential (primary) hypertension: Secondary | ICD-10-CM

## 2017-08-08 LAB — URINALYSIS
BILIRUBIN UA: NEGATIVE
Glucose, UA: NEGATIVE
Ketones, UA: NEGATIVE
Leukocytes, UA: NEGATIVE
Nitrite, UA: NEGATIVE
PH UA: 7 (ref 5.0–7.5)
PROTEIN UA: NEGATIVE
RBC, UA: NEGATIVE
Specific Gravity, UA: 1.01 (ref 1.005–1.030)
Urobilinogen, Ur: 0.2 mg/dL (ref 0.2–1.0)

## 2017-08-08 MED ORDER — ISOSORBIDE MONONITRATE ER 30 MG PO TB24: 30.0000 mg | ORAL_TABLET | Freq: Every day | ORAL | 1 refills | Status: DC

## 2017-08-08 MED ORDER — ACETAMINOPHEN-CODEINE #3 300-30 MG PO TABS: 1.0000 | ORAL_TABLET | Freq: Four times a day (QID) | ORAL | 0 refills | Status: AC | PRN

## 2017-08-08 MED ORDER — COREG 12.5 MG PO TABS: ORAL_TABLET | ORAL | 1 refills | Status: DC

## 2017-08-08 MED ORDER — ALTACE 10 MG PO CAPS: 10.0000 mg | ORAL_CAPSULE | Freq: Every day | ORAL | 1 refills | Status: DC

## 2017-08-08 MED ORDER — ATORVASTATIN CALCIUM 10 MG PO TABS: 10.0000 mg | ORAL_TABLET | Freq: Every day | ORAL | 3 refills | Status: DC

## 2017-08-08 MED ORDER — POTASSIUM CHLORIDE CRYS ER 20 MEQ PO TBCR: 40.0000 meq | EXTENDED_RELEASE_TABLET | Freq: Every day | ORAL | 1 refills | Status: DC

## 2017-08-08 MED ORDER — FUROSEMIDE 40 MG PO TABS: 80.0000 mg | ORAL_TABLET | Freq: Every day | ORAL | 1 refills | Status: DC

## 2017-08-08 NOTE — Progress Notes (Signed)
Subjective:  Patient ID: Alexis Powell, female    DOB: [birth year]  Age: 77 y.o. MRN: [id number]  CC: Follow-up (pt here today for routine follow up of her chronic medical conditions)   HPI Arlyne Brandes presents for  follow-up of hypertension. Patient has no history of headache chest pain or shortness of breath or recent cough. Patient also denies symptoms of TIA such as focal numbness or weakness. Patient checks  blood pressure at home. Readings recently nml. Patient denies side effects from medication. States taking it regularly. Patient is also followed for congestive heart failure which is been stable.  She had an echocardiogram done by Dr. Berline Lopes her cardiologist last summer.  That result is not available at this time.  Additionally she has been seeing Dr. Nani Gasser of [facility] chest for her COPD.  That is doing quite well.  Of note is that he did PFTs as a baseline about 11 months ago that showed an FEV1 to FVC ratio in the 40s.  With the use of the inhaler Trilepta she is now at 71 on that ratio.  She tells me that her shortness of breath is still present for activity.  She is still dependent on oxygen.  She has a tank with her and is wearing her nasal cannula at 2 L currently.  She has had much less swelling recently than in the past.  Medications are as noted below.  Patient in for follow-up of elevated cholesterol. Doing well without complaints on current medication. Denies side effects of statin including myalgia and arthralgia and nausea. Also in today for liver function testing. Currently no chest pain, shortness of breath or other cardiovascular related symptoms noted.  History Marguerita has a past medical history of Hyperlipidemia, Hypertension, and Myocardial infarction ([place]).   She has a past surgical history that includes Coronary artery bypass graft; Abdominal hysterectomy; Cesarean section; Appendectomy; and Cholecystectomy.   Her family history includes Heart disease in her  father.She reports that she quit smoking about 18 years ago. she has never used smokeless tobacco. She reports that she does not drink alcohol or use drugs.  Current Outpatient Medications on File Prior to Visit  Medication Sig Dispense Refill  . aspirin 81 MG chewable tablet Chew 81 mg by mouth daily.    . famotidine (PEPCID) 20 MG tablet Take 20 mg by mouth 2 (two) times daily.     . TRELEGY ELLIPTA 100-62.5-25 MCG/INH AEPB   0   No current facility-administered medications on file prior to visit.     ROS Review of Systems  Constitutional: Negative for activity change, appetite change and fever.  HENT: Negative for congestion, rhinorrhea and sore throat.   Eyes: Negative for visual disturbance.  Respiratory: Positive for shortness of breath. Negative for cough.   Cardiovascular: Negative for chest pain and palpitations.  Gastrointestinal: Negative for abdominal pain, diarrhea and nausea.  Genitourinary: Negative for dysuria.  Musculoskeletal: Negative for arthralgias and myalgias.    Objective:  BP 127/63   Pulse (!) 58   Temp (!) 96.6 F (35.9 C) (Oral)   Ht _0  (1.549 m)   Wt 221 lb (100.2 kg)   BMI 41.76 kg/m   BP Readings from Last 3 Encounters:  08/08/17 127/63  12/05/16 (!) 96/53  09/02/16 101/61    Wt Readings from Last 3 Encounters:  08/08/17 221 lb (100.2 kg)  12/05/16 220 lb (99.8 kg)  09/02/16 215 lb (97.5 kg)     Physical Exam  Constitutional:  She is oriented to person, place, and time. She appears well-developed and well-nourished. No distress.  HENT:  Head: Normocephalic and atraumatic.  Right Ear: External ear normal.  Left Ear: External ear normal.  Nose: Nose normal.  Mouth/Throat: Oropharynx is clear and moist.  Eyes: Conjunctivae and EOM are normal. Pupils are equal, round, and reactive to light.  Neck: Normal range of motion. Neck supple. No thyromegaly present.  Cardiovascular: Normal rate, regular rhythm and normal heart sounds.  No  murmur heard. Pulmonary/Chest: Effort normal and breath sounds normal. No respiratory distress. She has no wheezes. She has no rales.  Abdominal: Soft. Bowel sounds are normal. She exhibits no distension. There is no tenderness.  Lymphadenopathy:    She has no cervical adenopathy.  Neurological: She is alert and oriented to person, place, and time. She has normal reflexes.  Skin: Skin is warm and dry.  Psychiatric: She has a normal mood and affect. Her behavior is normal. Judgment and thought content normal.   Pulse oximetry: 95% resting on room air 80% walking 150 feet on room air 95% walking with 2 L of oxygen via nasal cannula   Assessment & Plan:   [place] was seen today for follow-up.  Diagnoses and all orders for this visit:  COPD mixed type ([place])  Congestive cardiomyopathy ([place])  Mixed hyperlipidemia -     Lipid panel  Essential hypertension -     CBC with Differential/Platelet -     CMP14+EGFR  Vitamin D deficiency -     VITAMIN D 25 Hydroxy (Vit-D Deficiency, Fractures)  UTI symptoms -     Urinalysis  Other orders -     ALTACE 10 MG capsule; Take 1 capsule (10 mg total) by mouth daily. -     atorvastatin (LIPITOR) 10 MG tablet; Take 1 tablet (10 mg total) by mouth daily. -     COREG 12.5 MG tablet; Take 1 tablet QAM and 2 in the evening. -     furosemide (LASIX) 40 MG tablet; Take 2 tablets (80 mg total) by mouth daily. -     isosorbide mononitrate (IMDUR) 30 MG 24 hr tablet; Take 1 tablet (30 mg total) by mouth daily. -     potassium chloride SA (K-DUR,KLOR-CON) 20 MEQ tablet; Take 2 tablets (40 mEq total) by mouth daily. -     acetaminophen-codeine (TYLENOL #3) 300-30 MG tablet; Take 1 tablet by mouth every 6 (six) hours as needed for moderate pain.   Allergies as of 08/08/2017      Reactions   Morphine And Related Anaphylaxis   Penicillins Anaphylaxis   Phenol Anaphylaxis   Sulfa Antibiotics Rash   Iodine-131 Rash   IVP dye      Medication List          Accurate as of 08/08/17 12:31 PM. Always use your most recent med list.          acetaminophen-codeine 300-30 MG tablet Commonly known as:  TYLENOL #3 Take 1 tablet by mouth every 6 (six) hours as needed for moderate pain.   ALTACE 10 MG capsule Generic drug:  ramipril Take 1 capsule (10 mg total) by mouth daily.   aspirin 81 MG chewable tablet Chew 81 mg by mouth daily.   atorvastatin 10 MG tablet Commonly known as:  LIPITOR Take 1 tablet (10 mg total) by mouth daily.   COREG 12.5 MG tablet Generic drug:  carvedilol Take 1 tablet QAM and 2 in the evening.   famotidine 20 MG  tablet Commonly known as:  PEPCID Take 20 mg by mouth 2 (two) times daily.   furosemide 40 MG tablet Commonly known as:  LASIX Take 2 tablets (80 mg total) by mouth daily.   isosorbide mononitrate 30 MG 24 hr tablet Commonly known as:  IMDUR Take 1 tablet (30 mg total) by mouth daily.   potassium chloride SA 20 MEQ tablet Commonly known as:  K-DUR,KLOR-CON Take 2 tablets (40 mEq total) by mouth daily.   TRELEGY ELLIPTA 100-62.5-25 MCG/INH Aepb Generic drug:  Fluticasone-Umeclidin-Vilant       Meds ordered this encounter  Medications  . ALTACE 10 MG capsule    Sig: Take 1 capsule (10 mg total) by mouth daily.    Dispense:  90 capsule    Refill:  1  . atorvastatin (LIPITOR) 10 MG tablet    Sig: Take 1 tablet (10 mg total) by mouth daily.    Dispense:  90 tablet    Refill:  3  . COREG 12.5 MG tablet    Sig: Take 1 tablet QAM and 2 in the evening.    Dispense:  270 tablet    Refill:  1  . furosemide (LASIX) 40 MG tablet    Sig: Take 2 tablets (80 mg total) by mouth daily.    Dispense:  180 tablet    Refill:  1  . isosorbide mononitrate (IMDUR) 30 MG 24 hr tablet    Sig: Take 1 tablet (30 mg total) by mouth daily.    Dispense:  90 tablet    Refill:  1  . potassium chloride SA (K-DUR,KLOR-CON) 20 MEQ tablet    Sig: Take 2 tablets (40 mEq total) by mouth daily.    Dispense:  180  tablet    Refill:  1  . acetaminophen-codeine (TYLENOL #3) 300-30 MG tablet    Sig: Take 1 tablet by mouth every 6 (six) hours as needed for moderate pain.    Dispense:  24 tablet    Refill:  0   Her COPD is definitely severe with end-stage oxygen dependence but very stable while using her oxygen very poor when not using her oxygen.  Her congestive heart failure appears to be at the best it has been for some time.  I just need to have a copy of that echocardiogram made available.  She is seeing at least one physician every 2-3 months when we alternate the pulmonologist the cardiologist and myself.  Therefore every 6 months seems appropriate as long as she follows up when she notices increased swelling or shortness of breath or change in other symptoms.  Follow-up: Return in about 6 months (around 02/05/2018).  Claretta Fraise, M.D.

## 2017-08-09 LAB — VITAMIN D 25 HYDROXY (VIT D DEFICIENCY, FRACTURES): Vit D, 25-Hydroxy: 17.9 ng/mL — ABNORMAL LOW (ref 30.0–100.0)

## 2017-08-09 LAB — CMP14+EGFR
ALK PHOS: 76 IU/L (ref 39–117)
ALT: 16 IU/L (ref 0–32)
AST: 26 IU/L (ref 0–40)
Albumin/Globulin Ratio: 1.8 (ref 1.2–2.2)
Albumin: 4.4 g/dL (ref 3.5–4.8)
BUN / CREAT RATIO: 19 (ref 12–28)
BUN: 23 mg/dL (ref 8–27)
Bilirubin Total: 0.6 mg/dL (ref 0.0–1.2)
CHLORIDE: 99 mmol/L (ref 96–106)
CO2: 28 mmol/L (ref 20–29)
Calcium: 9.3 mg/dL (ref 8.7–10.3)
Creatinine, Ser: 1.22 mg/dL — ABNORMAL HIGH (ref 0.57–1.00)
GFR calc Af Amer: 49 mL/min/{1.73_m2} — ABNORMAL LOW (ref >59)
GFR calc non Af Amer: 43 mL/min/{1.73_m2} — ABNORMAL LOW (ref >59)
GLUCOSE: 105 mg/dL — AB (ref 65–99)
Globulin, Total: 2.4 g/dL (ref 1.5–4.5)
Potassium: 4.5 mmol/L (ref 3.5–5.2)
Sodium: 143 mmol/L (ref 134–144)
Total Protein: 6.8 g/dL (ref 6.0–8.5)

## 2017-08-09 LAB — LIPID PANEL
CHOLESTEROL TOTAL: 207 mg/dL — AB (ref 100–199)
Chol/HDL Ratio: 3.8 ratio (ref 0.0–4.4)
HDL: 55 mg/dL (ref >39)
LDL CALC: 130 mg/dL — AB (ref 0–99)
TRIGLYCERIDES: 110 mg/dL (ref 0–149)
VLDL CHOLESTEROL CAL: 22 mg/dL (ref 5–40)

## 2017-08-09 LAB — CBC WITH DIFFERENTIAL/PLATELET
BASOS ABS: 0 10*3/uL (ref 0.0–0.2)
Basos: 0 %
EOS (ABSOLUTE): 0.1 10*3/uL (ref 0.0–0.4)
Eos: 2 %
HEMOGLOBIN: 12.3 g/dL (ref 11.1–15.9)
Hematocrit: 38.8 % (ref 34.0–46.6)
Immature Grans (Abs): 0 10*3/uL (ref 0.0–0.1)
Immature Granulocytes: 0 %
LYMPHS ABS: 1.4 10*3/uL (ref 0.7–3.1)
Lymphs: 25 %
MCH: 31.3 pg (ref 26.6–33.0)
MCHC: 31.7 g/dL (ref 31.5–35.7)
MCV: 99 fL — ABNORMAL HIGH (ref 79–97)
MONOCYTES: 11 %
Monocytes Absolute: 0.6 10*3/uL (ref 0.1–0.9)
NEUTROS ABS: 3.4 10*3/uL (ref 1.4–7.0)
Neutrophils: 62 %
PLATELETS: 160 10*3/uL (ref 150–379)
RBC: 3.93 x10E6/uL (ref 3.77–5.28)
RDW: 13 % (ref 12.3–15.4)
WBC: 5.5 10*3/uL (ref 3.4–10.8)

## 2017-08-25 ENCOUNTER — Encounter: Payer: Self-pay | Admitting: *Deleted

## 2017-08-25 IMAGING — CR DG CHEST 2V
2 series · 2 of 2 positions shown · non-contrast
Comparison: None.

CLINICAL DATA: Short of breath

EXAM:
CHEST  2 VIEW

[view not recorded (1 of 2)]
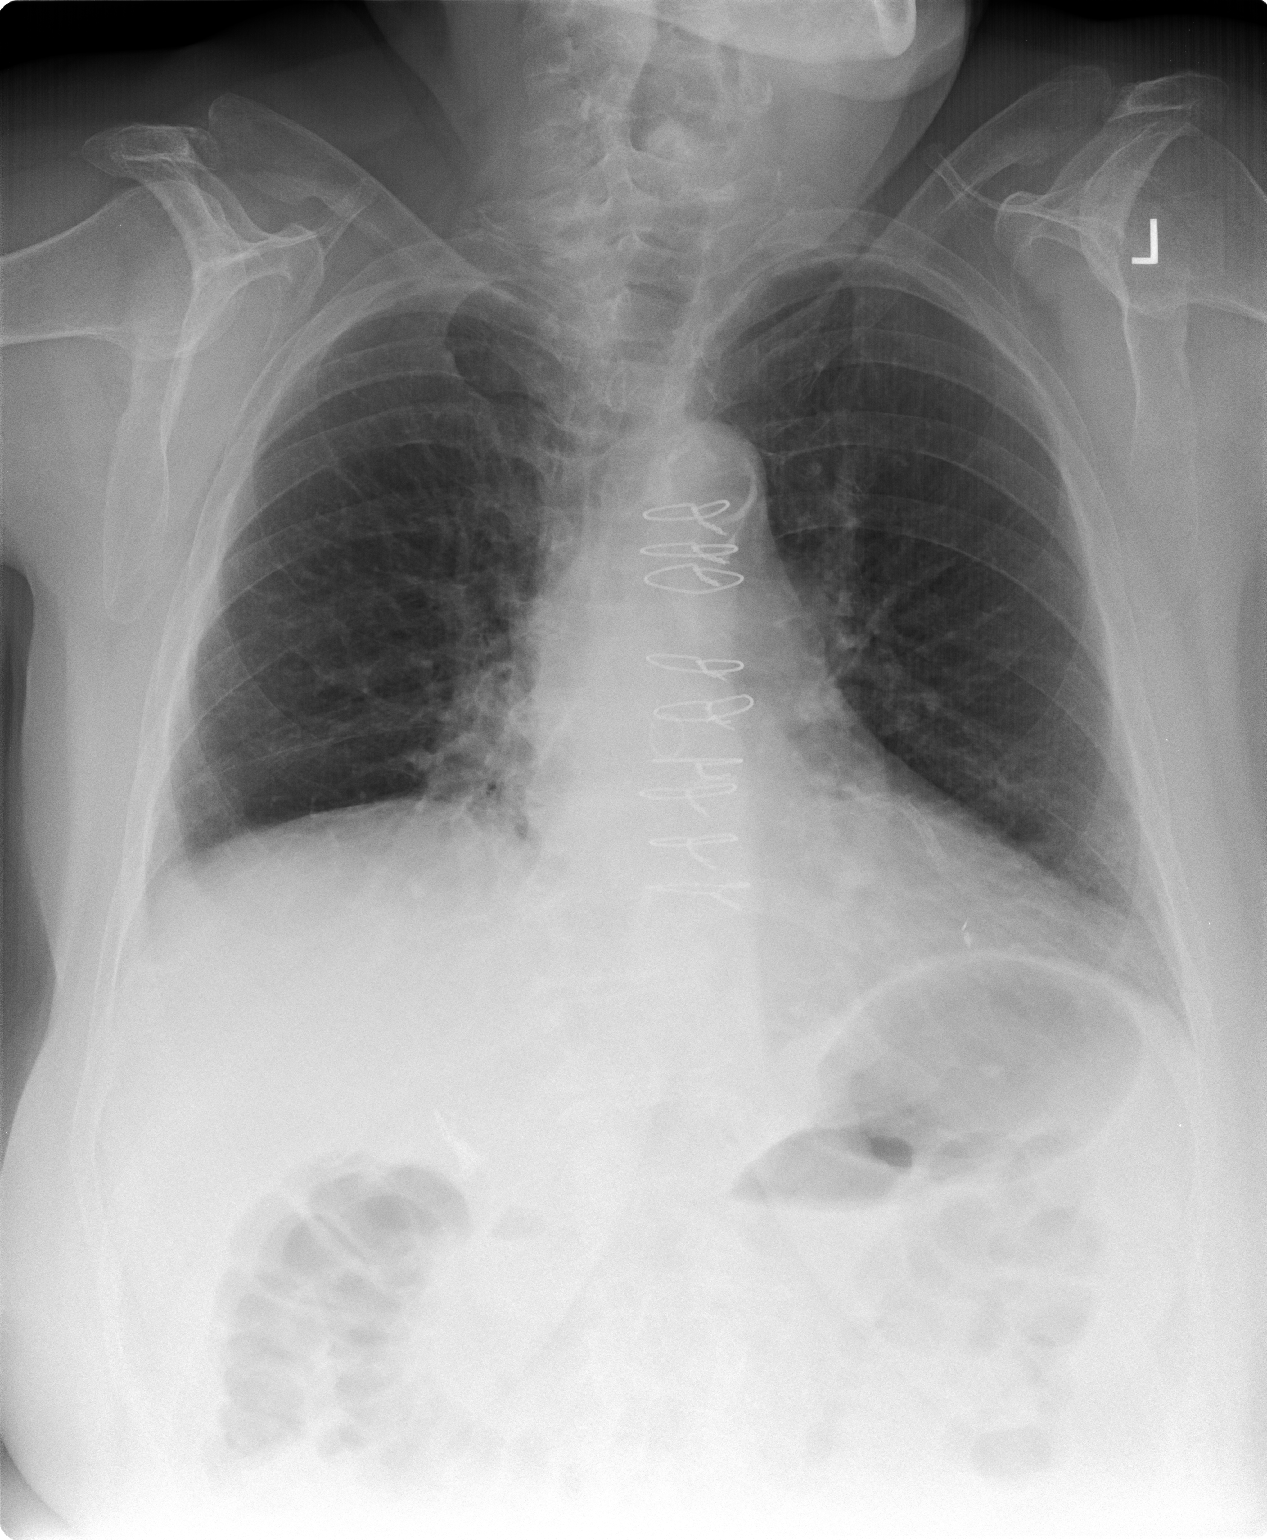

[view not recorded (2 of 2)]
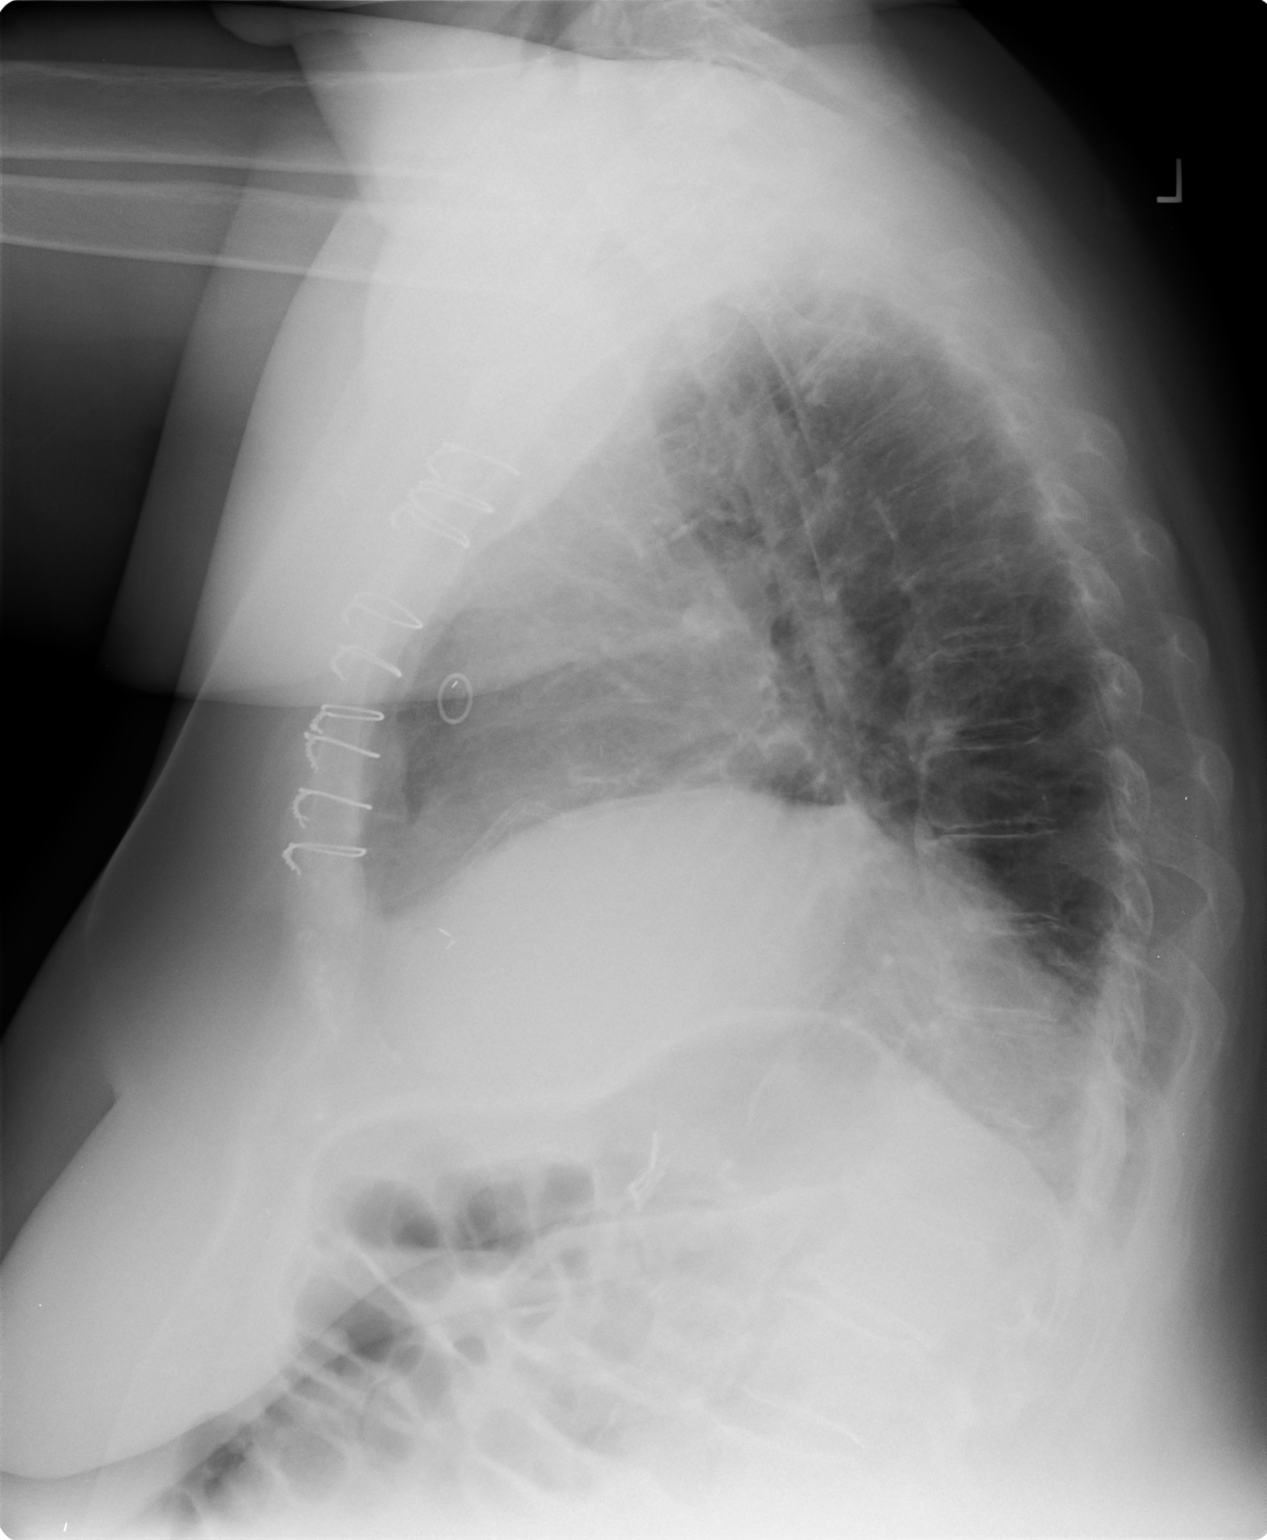

[2 of 2 positions shown; findings below may reference images not displayed]

FINDINGS: Sternotomy wires overlies normal cardiac silhouette. There is mild
basilar atelectasis and elevation of RIGHT hemidiaphragm. Lungs are
hyperinflated. No effusion, infiltrate, or pneumothorax. Coronary
artery stent noted.
IMPRESSION: 1. Hyperinflated lungs.  No acute findings.
2. Mild basilar atelectasis.

## 2017-08-28 ENCOUNTER — Other Ambulatory Visit: Payer: Self-pay | Admitting: Family Medicine

## 2017-08-28 ENCOUNTER — Telehealth: Payer: Self-pay | Admitting: Family Medicine

## 2017-08-28 ENCOUNTER — Telehealth: Payer: Self-pay | Admitting: *Deleted

## 2017-08-28 MED ORDER — COREG 6.25 MG PO TABS: 18.7500 mg | ORAL_TABLET | Freq: Two times a day (BID) | ORAL | 5 refills | Status: DC

## 2017-08-28 NOTE — Telephone Encounter (Signed)
I sent in the prescription as requested.  I did change the medication to 6.25 mg p.o. tablet 3 p.o. twice daily to even out her dosing.

## 2017-08-28 NOTE — Telephone Encounter (Signed)
Coreg 12.5mg  is on back order and not sure when will be available can they change to brand name coreg 6.25? Please advise

## 2017-08-30 ENCOUNTER — Telehealth: Payer: Self-pay | Admitting: Family Medicine

## 2017-08-30 ENCOUNTER — Other Ambulatory Visit: Payer: Self-pay | Admitting: Family Medicine

## 2017-08-30 MED ORDER — CARVEDILOL 12.5 MG PO TABS: ORAL_TABLET | ORAL | 1 refills | Status: DC

## 2017-08-30 NOTE — Telephone Encounter (Signed)
I sent in the requested prescription 

## 2017-08-30 NOTE — Telephone Encounter (Signed)
Patient aware.

## 2017-09-04 ENCOUNTER — Other Ambulatory Visit: Payer: Self-pay | Admitting: Family Medicine

## 2017-09-04 ENCOUNTER — Telehealth: Payer: Self-pay | Admitting: Family Medicine

## 2017-09-04 DIAGNOSIS — C419 Malignant neoplasm of bone and articular cartilage, unspecified: Secondary | ICD-10-CM

## 2017-09-04 MED ORDER — CARVEDILOL 12.5 MG PO TABS: ORAL_TABLET | ORAL | 1 refills | Status: DC

## 2017-09-04 NOTE — Telephone Encounter (Signed)
Pt has some questions about meds that she picked up from pharmacy her Coreg she states it is written different that what she has been rx. Also wants Scan of her leg done would not go into further detailed. Please advise

## 2017-09-04 NOTE — Telephone Encounter (Signed)
Please see below.  Patient is insistent she speak with you about this.  She did tell me she had been taking her Coreq 12.5 as 1/2 in the morning and one at night.  She would not tell me why she wanted a scan of her leg.

## 2017-09-18 ENCOUNTER — Telehealth: Payer: Self-pay | Admitting: Family Medicine

## 2017-09-18 MED ORDER — DIPHENHYDRAMINE HCL 25 MG PO TABS: 25.0000 mg | ORAL_TABLET | Freq: Four times a day (QID) | ORAL | 0 refills | Status: DC | PRN

## 2017-09-18 MED ORDER — PREDNISONE 20 MG PO TABS: 20.0000 mg | ORAL_TABLET | Freq: Every day | ORAL | 0 refills | Status: DC

## 2017-09-18 NOTE — Telephone Encounter (Signed)
Pt notified of orders Verbalizes understanding

## 2017-09-18 NOTE — Telephone Encounter (Signed)
Pt states she had a slight reaction to dye in her 20's and usually Dr Livia Snellen has her do prednisone and benadryl the night prior to any studies but I can't find where he sent any prednisone in before. Please advise.

## 2017-09-18 NOTE — Telephone Encounter (Signed)
I sent in a dose of prednisone and some Benadryl for her

## 2017-09-19 DIAGNOSIS — M4184 Other forms of scoliosis, thoracic region: Secondary | ICD-10-CM | POA: Diagnosis not present

## 2017-09-19 DIAGNOSIS — M4186 Other forms of scoliosis, lumbar region: Secondary | ICD-10-CM | POA: Diagnosis not present

## 2017-09-19 DIAGNOSIS — C419 Malignant neoplasm of bone and articular cartilage, unspecified: Secondary | ICD-10-CM | POA: Diagnosis not present

## 2017-09-19 DIAGNOSIS — C499 Malignant neoplasm of connective and soft tissue, unspecified: Secondary | ICD-10-CM | POA: Diagnosis not present

## 2017-09-20 ENCOUNTER — Encounter: Payer: Self-pay | Admitting: Family Medicine

## 2017-09-20 DIAGNOSIS — J449 Chronic obstructive pulmonary disease, unspecified: Secondary | ICD-10-CM | POA: Diagnosis not present

## 2017-09-20 DIAGNOSIS — R0902 Hypoxemia: Secondary | ICD-10-CM | POA: Diagnosis not present

## 2017-09-25 ENCOUNTER — Telehealth: Payer: Self-pay | Admitting: Family Medicine

## 2017-09-25 ENCOUNTER — Other Ambulatory Visit: Payer: Self-pay | Admitting: *Deleted

## 2017-09-25 DIAGNOSIS — R9389 Abnormal findings on diagnostic imaging of other specified body structures: Secondary | ICD-10-CM

## 2017-09-27 NOTE — Telephone Encounter (Signed)
Attempts have been made to contact patient - no call back- this encounter will be closed.

## 2017-10-04 ENCOUNTER — Other Ambulatory Visit: Payer: Self-pay

## 2017-10-04 ENCOUNTER — Telehealth: Payer: Self-pay | Admitting: Family Medicine

## 2017-10-04 DIAGNOSIS — R0602 Shortness of breath: Secondary | ICD-10-CM

## 2017-10-05 NOTE — Telephone Encounter (Signed)
Dr Livia Snellen called and spoke with the pt's daughter yesterday.

## 2017-10-09 DIAGNOSIS — C419 Malignant neoplasm of bone and articular cartilage, unspecified: Secondary | ICD-10-CM | POA: Diagnosis not present

## 2017-10-09 DIAGNOSIS — J986 Disorders of diaphragm: Secondary | ICD-10-CM | POA: Diagnosis not present

## 2017-10-09 DIAGNOSIS — R918 Other nonspecific abnormal finding of lung field: Secondary | ICD-10-CM | POA: Diagnosis not present

## 2017-10-09 DIAGNOSIS — R9389 Abnormal findings on diagnostic imaging of other specified body structures: Secondary | ICD-10-CM | POA: Diagnosis not present

## 2017-10-11 ENCOUNTER — Encounter: Payer: Self-pay | Admitting: Family Medicine

## 2017-10-13 ENCOUNTER — Telehealth: Payer: Self-pay | Admitting: Family Medicine

## 2017-10-13 NOTE — Telephone Encounter (Signed)
Result reviewed with pt from recent CT Chest. WS

## 2017-10-13 NOTE — Telephone Encounter (Signed)
Pt is aware of CT scan results but wanted to speak with Dr Livia Snellen.

## 2017-11-13 DIAGNOSIS — I350 Nonrheumatic aortic (valve) stenosis: Secondary | ICD-10-CM | POA: Diagnosis not present

## 2017-11-13 DIAGNOSIS — E785 Hyperlipidemia, unspecified: Secondary | ICD-10-CM | POA: Diagnosis not present

## 2017-11-13 DIAGNOSIS — I2510 Atherosclerotic heart disease of native coronary artery without angina pectoris: Secondary | ICD-10-CM | POA: Diagnosis not present

## 2017-11-13 DIAGNOSIS — I5022 Chronic systolic (congestive) heart failure: Secondary | ICD-10-CM | POA: Diagnosis not present

## 2017-11-13 DIAGNOSIS — Z951 Presence of aortocoronary bypass graft: Secondary | ICD-10-CM | POA: Diagnosis not present

## 2017-12-18 ENCOUNTER — Encounter: Payer: Self-pay | Admitting: Family Medicine

## 2017-12-18 DIAGNOSIS — I083 Combined rheumatic disorders of mitral, aortic and tricuspid valves: Secondary | ICD-10-CM | POA: Diagnosis not present

## 2018-01-03 DIAGNOSIS — H2511 Age-related nuclear cataract, right eye: Secondary | ICD-10-CM | POA: Diagnosis not present

## 2018-01-03 DIAGNOSIS — H2513 Age-related nuclear cataract, bilateral: Secondary | ICD-10-CM | POA: Diagnosis not present

## 2018-01-03 DIAGNOSIS — H04123 Dry eye syndrome of bilateral lacrimal glands: Secondary | ICD-10-CM | POA: Diagnosis not present

## 2018-01-08 DIAGNOSIS — J438 Other emphysema: Secondary | ICD-10-CM | POA: Diagnosis not present

## 2018-01-08 DIAGNOSIS — I5022 Chronic systolic (congestive) heart failure: Secondary | ICD-10-CM | POA: Diagnosis not present

## 2018-01-08 DIAGNOSIS — Z885 Allergy status to narcotic agent status: Secondary | ICD-10-CM | POA: Diagnosis not present

## 2018-01-08 DIAGNOSIS — I428 Other cardiomyopathies: Secondary | ICD-10-CM | POA: Diagnosis not present

## 2018-01-08 DIAGNOSIS — Z882 Allergy status to sulfonamides status: Secondary | ICD-10-CM | POA: Diagnosis not present

## 2018-01-08 DIAGNOSIS — I2510 Atherosclerotic heart disease of native coronary artery without angina pectoris: Secondary | ICD-10-CM | POA: Diagnosis not present

## 2018-01-08 DIAGNOSIS — I350 Nonrheumatic aortic (valve) stenosis: Secondary | ICD-10-CM | POA: Diagnosis not present

## 2018-01-08 DIAGNOSIS — I110 Hypertensive heart disease with heart failure: Secondary | ICD-10-CM | POA: Diagnosis not present

## 2018-01-08 DIAGNOSIS — H2513 Age-related nuclear cataract, bilateral: Secondary | ICD-10-CM | POA: Diagnosis not present

## 2018-01-08 DIAGNOSIS — H2511 Age-related nuclear cataract, right eye: Secondary | ICD-10-CM | POA: Diagnosis not present

## 2018-01-08 DIAGNOSIS — Z880 Allergy status to penicillin: Secondary | ICD-10-CM | POA: Diagnosis not present

## 2018-01-08 DIAGNOSIS — H04123 Dry eye syndrome of bilateral lacrimal glands: Secondary | ICD-10-CM | POA: Diagnosis not present

## 2018-01-08 DIAGNOSIS — E669 Obesity, unspecified: Secondary | ICD-10-CM | POA: Diagnosis not present

## 2018-01-29 DIAGNOSIS — Z888 Allergy status to other drugs, medicaments and biological substances status: Secondary | ICD-10-CM | POA: Diagnosis not present

## 2018-01-29 DIAGNOSIS — H04123 Dry eye syndrome of bilateral lacrimal glands: Secondary | ICD-10-CM | POA: Diagnosis not present

## 2018-01-29 DIAGNOSIS — Z961 Presence of intraocular lens: Secondary | ICD-10-CM | POA: Diagnosis not present

## 2018-01-29 DIAGNOSIS — Z951 Presence of aortocoronary bypass graft: Secondary | ICD-10-CM | POA: Diagnosis not present

## 2018-01-29 DIAGNOSIS — Z9981 Dependence on supplemental oxygen: Secondary | ICD-10-CM | POA: Diagnosis not present

## 2018-01-29 DIAGNOSIS — Z9841 Cataract extraction status, right eye: Secondary | ICD-10-CM | POA: Diagnosis not present

## 2018-01-29 DIAGNOSIS — I2510 Atherosclerotic heart disease of native coronary artery without angina pectoris: Secondary | ICD-10-CM | POA: Diagnosis not present

## 2018-01-29 DIAGNOSIS — E785 Hyperlipidemia, unspecified: Secondary | ICD-10-CM | POA: Diagnosis not present

## 2018-01-29 DIAGNOSIS — Z87891 Personal history of nicotine dependence: Secondary | ICD-10-CM | POA: Diagnosis not present

## 2018-01-29 DIAGNOSIS — I350 Nonrheumatic aortic (valve) stenosis: Secondary | ICD-10-CM | POA: Diagnosis not present

## 2018-01-29 DIAGNOSIS — I5020 Unspecified systolic (congestive) heart failure: Secondary | ICD-10-CM | POA: Diagnosis not present

## 2018-01-29 DIAGNOSIS — H2512 Age-related nuclear cataract, left eye: Secondary | ICD-10-CM | POA: Diagnosis not present

## 2018-01-29 DIAGNOSIS — Z885 Allergy status to narcotic agent status: Secondary | ICD-10-CM | POA: Diagnosis not present

## 2018-01-29 DIAGNOSIS — I110 Hypertensive heart disease with heart failure: Secondary | ICD-10-CM | POA: Diagnosis not present

## 2018-01-29 DIAGNOSIS — J438 Other emphysema: Secondary | ICD-10-CM | POA: Diagnosis not present

## 2018-01-29 DIAGNOSIS — I252 Old myocardial infarction: Secondary | ICD-10-CM | POA: Diagnosis not present

## 2018-01-29 DIAGNOSIS — Z882 Allergy status to sulfonamides status: Secondary | ICD-10-CM | POA: Diagnosis not present

## 2018-01-29 DIAGNOSIS — Z880 Allergy status to penicillin: Secondary | ICD-10-CM | POA: Diagnosis not present

## 2018-02-05 ENCOUNTER — Ambulatory Visit: Payer: Medicare Other | Admitting: Family Medicine

## 2018-02-28 ENCOUNTER — Other Ambulatory Visit: Payer: Self-pay | Admitting: Family Medicine

## 2018-02-28 NOTE — Telephone Encounter (Signed)
Last seen 08/08/17 

## 2018-03-14 ENCOUNTER — Other Ambulatory Visit: Payer: Self-pay | Admitting: Family Medicine

## 2018-03-14 NOTE — Telephone Encounter (Signed)
Last seen 08/08/17 

## 2018-03-16 ENCOUNTER — Other Ambulatory Visit: Payer: Self-pay | Admitting: Family Medicine

## 2018-03-26 DIAGNOSIS — L821 Other seborrheic keratosis: Secondary | ICD-10-CM | POA: Diagnosis not present

## 2018-03-26 DIAGNOSIS — L570 Actinic keratosis: Secondary | ICD-10-CM | POA: Diagnosis not present

## 2018-03-26 DIAGNOSIS — L579 Skin changes due to chronic exposure to nonionizing radiation, unspecified: Secondary | ICD-10-CM | POA: Diagnosis not present

## 2018-03-26 DIAGNOSIS — D1801 Hemangioma of skin and subcutaneous tissue: Secondary | ICD-10-CM | POA: Diagnosis not present

## 2018-03-26 DIAGNOSIS — L814 Other melanin hyperpigmentation: Secondary | ICD-10-CM | POA: Diagnosis not present

## 2018-03-26 DIAGNOSIS — D225 Melanocytic nevi of trunk: Secondary | ICD-10-CM | POA: Diagnosis not present

## 2018-03-30 DIAGNOSIS — I428 Other cardiomyopathies: Secondary | ICD-10-CM | POA: Diagnosis not present

## 2018-03-30 DIAGNOSIS — J449 Chronic obstructive pulmonary disease, unspecified: Secondary | ICD-10-CM | POA: Diagnosis not present

## 2018-03-30 DIAGNOSIS — Z9981 Dependence on supplemental oxygen: Secondary | ICD-10-CM | POA: Diagnosis not present

## 2018-03-30 DIAGNOSIS — R0902 Hypoxemia: Secondary | ICD-10-CM | POA: Diagnosis not present

## 2018-04-05 ENCOUNTER — Ambulatory Visit: Payer: Medicare Other | Admitting: Family Medicine

## 2018-04-16 ENCOUNTER — Encounter: Payer: Self-pay | Admitting: Family Medicine

## 2018-04-16 ENCOUNTER — Ambulatory Visit (INDEPENDENT_AMBULATORY_CARE_PROVIDER_SITE_OTHER): Payer: Medicare Other | Admitting: Family Medicine

## 2018-04-16 VITALS — BP 134/64 | HR 54 | Temp 96.9°F | Ht 61.0 in | Wt 219.0 lb

## 2018-04-16 DIAGNOSIS — E782 Mixed hyperlipidemia: Secondary | ICD-10-CM

## 2018-04-16 DIAGNOSIS — I10 Essential (primary) hypertension: Secondary | ICD-10-CM | POA: Diagnosis not present

## 2018-04-16 MED ORDER — ALTACE 10 MG PO CAPS: 10.0000 mg | ORAL_CAPSULE | Freq: Every day | ORAL | 1 refills | Status: DC

## 2018-04-16 MED ORDER — POTASSIUM CHLORIDE CRYS ER 20 MEQ PO TBCR: 40.0000 meq | EXTENDED_RELEASE_TABLET | Freq: Every day | ORAL | 1 refills | Status: DC

## 2018-04-16 MED ORDER — ISOSORBIDE MONONITRATE ER 30 MG PO TB24: 30.0000 mg | ORAL_TABLET | Freq: Every day | ORAL | 1 refills | Status: DC

## 2018-04-16 MED ORDER — FUROSEMIDE 40 MG PO TABS: 80.0000 mg | ORAL_TABLET | Freq: Every day | ORAL | 1 refills | Status: DC

## 2018-04-16 MED ORDER — FAMOTIDINE 20 MG PO TABS: 20.0000 mg | ORAL_TABLET | Freq: Two times a day (BID) | ORAL | 1 refills | Status: DC

## 2018-04-17 ENCOUNTER — Encounter: Payer: Self-pay | Admitting: Family Medicine

## 2018-04-17 NOTE — Progress Notes (Signed)
Subjective:  Patient ID: Alexis Powell, female    DOB: Apr 04, 1941  Age: 77 y.o. MRN: [id number]  CC: Hyperlipidemia (6 mo) and Hypertension   HPI Alexis Powell presents for feeling much better.  She is wearing oxygen all the time it agrees with her.  She has no new complaints today.   Follow-up of hypertension. Patient has no history of headache chest pain or shortness of breath or recent cough. Patient also denies symptoms of TIA such as numbness weakness lateralizing. Patient checks  blood pressure at home and has not had any elevated readings recently. Patient denies side effects from his medication. States taking it regularly. Patient in for follow-up of elevated cholesterol.  She relates that she discontinued her statin and feels much much better than she has in a long long time.  The aches and pains were just intolerable.  She is aware that this increases her cardiac risk.  We discussed in detail.  She wants to work on it from the standpoint of diet first and then will consider generation medications such as Praluent. Depression screen [facility] 2/9 04/16/2018 08/08/2017 12/05/2016  Decreased Interest 0 0 0  Down, Depressed, Hopeless 0 0 0  PHQ - 2 Score 0 0 0    History Alexis Powell has a past medical history of Hyperlipidemia, Hypertension, and Myocardial infarction ([place]).   She has a past surgical history that includes Coronary artery bypass graft; Abdominal hysterectomy; Cesarean section; Appendectomy; Cholecystectomy; and Eye surgery (Bilateral).   Her family history includes Heart disease in her father.She reports that she quit smoking about 19 years ago. She has never used smokeless tobacco. She reports that she does not drink alcohol or use drugs.    ROS Review of Systems  Constitutional: Negative.   HENT: Negative for congestion.   Eyes: Negative for visual disturbance.  Respiratory: Positive for shortness of breath (Intermittently but relieved when using oxygen unless she is doing  something more strenuous than usual.).   Cardiovascular: Negative for chest pain.  Gastrointestinal: Negative for abdominal pain, constipation, diarrhea, nausea and vomiting.  Genitourinary: Negative for difficulty urinating.  Musculoskeletal: Negative for arthralgias and myalgias.  Neurological: Negative for headaches.  Psychiatric/Behavioral: Negative for sleep disturbance.    Objective:  BP 134/64 (BP Location: Left Wrist)   Pulse (!) 54   Temp (!) 96.9 F (36.1 C) (Oral)   Ht 5\' 1"  (1.549 m)   Wt 219 lb (99.3 kg)   SpO2 99% Comment: on 3 liters  BMI 41.38 kg/m   BP Readings from Last 3 Encounters:  04/16/18 134/64  08/08/17 127/63  12/05/16 (!) 96/53    Wt Readings from Last 3 Encounters:  04/16/18 219 lb (99.3 kg)  08/08/17 221 lb (100.2 kg)  12/05/16 220 lb (99.8 kg)     Physical Exam  Constitutional: She is oriented to person, place, and time. She appears well-developed and well-nourished. No distress.  HENT:  Head: Normocephalic and atraumatic.  Right Ear: External ear normal.  Left Ear: External ear normal.  Nose: Nose normal.  Mouth/Throat: Oropharynx is clear and moist.  Eyes: Pupils are equal, round, and reactive to light. Conjunctivae and EOM are normal.  Neck: Normal range of motion. Neck supple. No thyromegaly present.  Cardiovascular: Normal rate, regular rhythm and normal heart sounds.  No murmur heard. Pulmonary/Chest: Effort normal and breath sounds normal. No respiratory distress. She has no wheezes. She has no rales.  Abdominal: Soft. Bowel sounds are normal. She exhibits no distension. There is no  tenderness.  Lymphadenopathy:    She has no cervical adenopathy.  Neurological: She is alert and oriented to person, place, and time. She has normal reflexes.  Skin: Skin is warm and dry.  Psychiatric: She has a normal mood and affect. Her behavior is normal. Judgment and thought content normal.      Assessment & Plan:   Alexis Powell was seen today for  hyperlipidemia and hypertension.  Diagnoses and all orders for this visit:  Mixed hyperlipidemia  Essential hypertension  Other orders -     potassium chloride SA (K-DUR,KLOR-CON) 20 MEQ tablet; Take 2 tablets (40 mEq total) by mouth daily. -     isosorbide mononitrate (IMDUR) 30 MG 24 hr tablet; Take 1 tablet (30 mg total) by mouth daily. -     furosemide (LASIX) 40 MG tablet; Take 2 tablets (80 mg total) by mouth daily. -     famotidine (PEPCID) 20 MG tablet; Take 1 tablet (20 mg total) by mouth 2 (two) times daily. -     ALTACE 10 MG capsule; Take 1 capsule (10 mg total) by mouth daily.       I have discontinued Alexis Powell's atorvastatin and predniSONE. I have also changed her potassium chloride SA, isosorbide mononitrate, furosemide, and famotidine. Additionally, I am having her maintain her aspirin, TRELEGY ELLIPTA, acetaminophen-codeine, carvedilol, diphenhydrAMINE, and ALTACE.  Allergies as of 04/16/2018      Reactions   Morphine And Related Anaphylaxis   Penicillins Anaphylaxis   Phenol Anaphylaxis   Sulfa Antibiotics Rash   Iodine-131 Rash   IVP dye      Medication List        Accurate as of 04/16/18 11:59 PM. Always use your most recent med list.          acetaminophen-codeine 300-30 MG tablet Commonly known as:  TYLENOL #3 Take 1 tablet by mouth every 6 (six) hours as needed for moderate pain.   ALTACE 10 MG capsule Generic drug:  ramipril Take 1 capsule (10 mg total) by mouth daily.   aspirin 81 MG chewable tablet Chew 81 mg by mouth daily.   carvedilol 12.5 MG tablet Commonly known as:  COREG Take 1/2 tablet QAM and 1 in the evening.   diphenhydrAMINE 25 MG tablet Commonly known as:  BENADRYL Take 1 tablet (25 mg total) by mouth every 6 (six) hours as needed.   famotidine 20 MG tablet Commonly known as:  PEPCID Take 1 tablet (20 mg total) by mouth 2 (two) times daily.   furosemide 40 MG tablet Commonly known as:  LASIX Take 2 tablets  (80 mg total) by mouth daily.   isosorbide mononitrate 30 MG 24 hr tablet Commonly known as:  IMDUR Take 1 tablet (30 mg total) by mouth daily.   potassium chloride SA 20 MEQ tablet Commonly known as:  K-DUR,KLOR-CON Take 2 tablets (40 mEq total) by mouth daily.   TRELEGY ELLIPTA 100-62.5-25 MCG/INH Aepb Generic drug:  Fluticasone-Umeclidin-Vilant        Follow-up: Return in about 6 months (around 10/16/2018).  Claretta Fraise, M.D.

## 2018-05-29 ENCOUNTER — Other Ambulatory Visit: Payer: Self-pay | Admitting: Family Medicine

## 2018-08-16 ENCOUNTER — Other Ambulatory Visit: Payer: Self-pay | Admitting: Family Medicine

## 2018-08-29 DIAGNOSIS — I208 Other forms of angina pectoris: Secondary | ICD-10-CM | POA: Diagnosis not present

## 2018-09-18 ENCOUNTER — Other Ambulatory Visit: Payer: Self-pay | Admitting: Family Medicine

## 2018-10-02 ENCOUNTER — Other Ambulatory Visit: Payer: Self-pay | Admitting: Family Medicine

## 2018-10-16 ENCOUNTER — Ambulatory Visit: Payer: Medicare Other | Admitting: Family Medicine

## 2018-11-16 ENCOUNTER — Other Ambulatory Visit: Payer: Self-pay | Admitting: Family Medicine

## 2018-11-16 NOTE — Telephone Encounter (Signed)
Stacks. NTBS 6 mos ckup was April. Refill sent to pharmacy

## 2018-11-20 ENCOUNTER — Other Ambulatory Visit: Payer: Self-pay | Admitting: Family Medicine

## 2018-11-29 ENCOUNTER — Other Ambulatory Visit: Payer: Self-pay | Admitting: Family Medicine

## 2018-12-07 ENCOUNTER — Ambulatory Visit (INDEPENDENT_AMBULATORY_CARE_PROVIDER_SITE_OTHER): Payer: Medicare Other | Admitting: Family Medicine

## 2018-12-07 ENCOUNTER — Encounter: Payer: Self-pay | Admitting: Family Medicine

## 2018-12-07 ENCOUNTER — Other Ambulatory Visit: Payer: Self-pay

## 2018-12-07 DIAGNOSIS — J449 Chronic obstructive pulmonary disease, unspecified: Secondary | ICD-10-CM

## 2018-12-07 DIAGNOSIS — I10 Essential (primary) hypertension: Secondary | ICD-10-CM | POA: Diagnosis not present

## 2018-12-07 DIAGNOSIS — E782 Mixed hyperlipidemia: Secondary | ICD-10-CM | POA: Diagnosis not present

## 2018-12-07 NOTE — Progress Notes (Signed)
Subjective:  Patient ID: Alexis Powell, female    DOB: 12/03/1940  Age: 78 y.o. MRN: [id number]  CC: No chief complaint on file.   HPI Alexis Powell presents for  follow-up of hypertension. Patient has no history of headache chest pain or shortness of breath or recent cough. Patient also denies symptoms of TIA such as focal numbness or weakness. Patient denies side effects from medication. States taking it regularly. BP 114/62 HR 64 Sat 98% on  3 liters PO2: 86-87 without O2. Uses 2 liters when not active, 3 when active. Saw Dr. Lamona Curl (cardiology) for ht. Murmur. No active heart failure in spite of her history of ischemic cardiomyopathy. History Alexis Powell has a past medical history of Hyperlipidemia, Hypertension, and Myocardial infarction ([place]).   She has a past surgical history that includes Coronary artery bypass graft; Abdominal hysterectomy; Cesarean section; Appendectomy; Cholecystectomy; and Eye surgery (Bilateral).   Her family history includes Heart disease in her father.She reports that she quit smoking about 19 years ago. She has never used smokeless tobacco. She reports that she does not drink alcohol or use drugs.  Current Outpatient Medications on File Prior to Visit  Medication Sig Dispense Refill  . acetaminophen-codeine (TYLENOL #3) 300-30 MG tablet Take 1 tablet by mouth every 6 (six) hours as needed for moderate pain. 24 tablet 0  . ALTACE 10 MG capsule TAKE 1 CAPSULE(10 MG) BY MOUTH DAILY 90 capsule 1  . aspirin 81 MG chewable tablet Chew 81 mg by mouth daily.    . carvedilol (COREG) 12.5 MG tablet TAKE 1/2 TABLET BY MOUTH EVERY MORNING AND 1 TABLET EVERY EVENING 135 tablet 0  . diphenhydrAMINE (BENADRYL ALLERGY) 25 MG tablet Take 1 tablet (25 mg total) by mouth every 6 (six) hours as needed. 30 tablet 0  . famotidine (PEPCID) 20 MG tablet TAKE 1 TABLET(20 MG) BY MOUTH TWICE DAILY 180 tablet 0  . furosemide (LASIX) 40 MG tablet Take 2 tablets (80 mg total) by mouth daily.  (Please make your 6 mos ckup) 180 tablet 0  . isosorbide mononitrate (IMDUR) 30 MG 24 hr tablet Take 1 tablet (30 mg total) by mouth daily. (Please make your 6 mos appt) 90 tablet 0  . potassium chloride SA (K-DUR) 20 MEQ tablet Take 2 tablets (40 mEq total) by mouth daily. (Please make your 6 month appt) 180 tablet 0  . TRELEGY ELLIPTA 100-62.5-25 MCG/INH AEPB   0   No current facility-administered medications on file prior to visit.     ROS Review of Systems  Constitutional: Negative.   HENT: Negative for congestion.   Eyes: Negative for visual disturbance.  Respiratory: Negative for shortness of breath.   Cardiovascular: Negative for chest pain.  Gastrointestinal: Negative for abdominal pain, constipation, diarrhea, nausea and vomiting.  Genitourinary: Negative for difficulty urinating.  Musculoskeletal: Negative for arthralgias and myalgias.  Neurological: Negative for headaches.  Psychiatric/Behavioral: Negative for sleep disturbance.    Objective:  There were no vitals taken for this visit.  BP Readings from Last 3 Encounters:  04/16/18 134/64  08/08/17 127/63  12/05/16 (!) 96/53    Wt Readings from Last 3 Encounters:  04/16/18 219 lb (99.3 kg)  08/08/17 221 lb (100.2 kg)  12/05/16 220 lb (99.8 kg)     Physical Exam  Exam deferred. Pt. Harboring due to COVID 19. Phone visit performed.   Assessment & Plan:   Diagnoses and all orders for this visit:  COPD mixed type [facility])  Essential hypertension  Mixed  hyperlipidemia   Allergies as of 12/07/2018      Reactions   Morphine And Related Anaphylaxis   Penicillins Anaphylaxis   Phenol Anaphylaxis   Sulfa Antibiotics Rash   Iodine-131 Rash   IVP dye      Medication List       Accurate as of December 07, 2018 11:59 PM. If you have any questions, ask your nurse or doctor.        acetaminophen-codeine 300-30 MG tablet Commonly known as:  TYLENOL #3 Take 1 tablet by mouth every 6 (six) hours as needed for  moderate pain.   Altace 10 MG capsule Generic drug:  ramipril TAKE 1 CAPSULE(10 MG) BY MOUTH DAILY   aspirin 81 MG chewable tablet Chew 81 mg by mouth daily.   carvedilol 12.5 MG tablet Commonly known as:  Coreg TAKE 1/2 TABLET BY MOUTH EVERY MORNING AND 1 TABLET EVERY EVENING   diphenhydrAMINE 25 MG tablet Commonly known as:  Benadryl Allergy Take 1 tablet (25 mg total) by mouth every 6 (six) hours as needed.   famotidine 20 MG tablet Commonly known as:  PEPCID TAKE 1 TABLET(20 MG) BY MOUTH TWICE DAILY   furosemide 40 MG tablet Commonly known as:  LASIX Take 2 tablets (80 mg total) by mouth daily. (Please make your 6 mos ckup)   isosorbide mononitrate 30 MG 24 hr tablet Commonly known as:  IMDUR Take 1 tablet (30 mg total) by mouth daily. (Please make your 6 mos appt)   potassium chloride SA 20 MEQ tablet Commonly known as:  K-DUR Take 2 tablets (40 mEq total) by mouth daily. (Please make your 6 month appt)   Trelegy Ellipta 100-62.5-25 MCG/INH Aepb Generic drug:  Fluticasone-Umeclidin-Vilant       No orders of the defined types were placed in this encounter.   Pt. Doing well on current regimen. All conditions stable. Continue current treatment plan.   Follow-up: Return in about 3 months (around 03/09/2019).  Claretta Fraise, M.D.

## 2018-12-25 ENCOUNTER — Other Ambulatory Visit: Payer: Self-pay | Admitting: Family Medicine

## 2018-12-28 ENCOUNTER — Ambulatory Visit (INDEPENDENT_AMBULATORY_CARE_PROVIDER_SITE_OTHER): Payer: Medicare Other | Admitting: *Deleted

## 2018-12-28 ENCOUNTER — Other Ambulatory Visit: Payer: Self-pay

## 2018-12-28 VITALS — Ht 61.0 in | Wt 219.0 lb

## 2018-12-28 DIAGNOSIS — Z0000 Encounter for general adult medical examination without abnormal findings: Secondary | ICD-10-CM

## 2018-12-28 NOTE — Patient Instructions (Signed)
Alexis Powell , Thank you for taking time to come for your Medicare Wellness Visit. I appreciate your ongoing commitment to your health goals. Please review the following plan we discussed and let me know if I can assist you in the future.   These are the goals we discussed: Goals     Exercise 3x per week (30 min per time)     Try to exercise for at least 30 minutes, 3 times weekly       This is a list of the screening recommended for you and due dates:  Health Maintenance  Topic Date Due   Tetanus Vaccine  09/20/1957   Pneumonia vaccines (2 of 2 - PPSV23) 05/04/2014   Flu Shot  02/02/2019   DEXA scan (bone density measurement)  Completed    Advance Directive  Advance directives are legal documents that let you make choices ahead of time about your health care and medical treatment in case you become unable to communicate for yourself. Advance directives are a way for you to communicate your wishes to family, friends, and health care providers. This can help convey your decisions about end-of-life care if you become unable to communicate. Discussing and writing advance directives should happen over time rather than all at once. Advance directives can be changed depending on your situation and what you want, even after you have signed the advance directives. If you do not have an advance directive, some states assign family decision makers to act on your behalf based on how closely you are related to them. Each state has its own laws regarding advance directives. You may want to check with your health care provider, attorney, or state representative about the laws in your state. There are different types of advance directives, such as:  Medical power of attorney.  Living will.  Do not resuscitate (DNR) or do not attempt resuscitation (DNAR) order. Health care proxy and medical power of attorney A health care proxy, also called a health care agent, is a person who is appointed to  make medical decisions for you in cases in which you are unable to make the decisions yourself. Generally, people choose someone they know well and trust to represent their preferences. Make sure to ask this person for an agreement to act as your proxy. A proxy may have to exercise judgment in the event of a medical decision for which your wishes are not known. A medical power of attorney is a legal document that names your health care proxy. Depending on the laws in your state, after the document is written, it may also need to be:  Signed.  Notarized.  Dated.  Copied.  Witnessed.  Incorporated into your medical record. You may also want to appoint someone to manage your financial affairs in a situation in which you are unable to do so. This is called a durable power of attorney for finances. It is a separate legal document from the durable power of attorney for health care. You may choose the same person or someone different from your health care proxy to act as your agent in financial matters. If you do not appoint a proxy, or if there is a concern that the proxy is not acting in your best interests, a court-appointed guardian may be designated to act on your behalf. Living will A living will is a set of instructions documenting your wishes about medical care when you cannot express them yourself. Health care providers should keep a copy of your  living will in your medical record. You may want to give a copy to family members or friends. To alert caregivers in case of an emergency, you can place a card in your wallet to let them know that you have a living will and where they can find it. A living will is used if you become:  Terminally ill.  Incapacitated.  Unable to communicate or make decisions. Items to consider in your living will include:  The use or non-use of life-sustaining equipment, such as dialysis machines and breathing machines (ventilators).  A DNR or DNAR order, which  is the instruction not to use cardiopulmonary resuscitation (CPR) if breathing or heartbeat stops.  The use or non-use of tube feeding.  Withholding of food and fluids.  Comfort (palliative) care when the goal becomes comfort rather than a cure.  Organ and tissue donation. A living will does not give instructions for distributing your money and property if you should pass away. It is recommended that you seek the advice of a lawyer when writing a will. Decisions about taxes, beneficiaries, and asset distribution will be legally binding. This process can relieve your family and friends of any concerns surrounding disputes or questions that may come up about the distribution of your assets. DNR or DNAR A DNR or DNAR order is a request not to have CPR in the event that your heart stops beating or you stop breathing. If a DNR or DNAR order has not been made and shared, a health care provider will try to help any patient whose heart has stopped or who has stopped breathing. If you plan to have surgery, talk with your health care provider about how your DNR or DNAR order will be followed if problems occur. Summary  Advance directives are the legal documents that allow you to make choices ahead of time about your health care and medical treatment in case you become unable to communicate for yourself.  The process of discussing and writing advance directives should happen over time. You can change the advance directives, even after you have signed them.  Advance directives include DNR or DNAR orders, living wills, and designating an agent as your medical power of attorney. This information is not intended to replace advice given to you by your health care provider. Make sure you discuss any questions you have with your health care provider. Document Released: 09/27/2007 Document Revised: 05/09/2016 Document Reviewed: 05/09/2016 Elsevier Interactive Patient Education  2019 [employer].

## 2018-12-28 NOTE — Progress Notes (Signed)
MEDICARE ANNUAL WELLNESS VISIT  12/28/2018  Telephone Visit Disclaimer This Medicare AWV was conducted by telephone due to national recommendations for restrictions regarding the COVID-19 Pandemic (e.g. social distancing).  I verified, using two identifiers, that I am speaking with Alexis Powell or their authorized healthcare agent. I discussed the limitations, risks, security, and privacy concerns of performing an evaluation and management service by telephone and the potential availability of an in-person appointment in the future. The patient expressed understanding and agreed to proceed.   Subjective:  Alexis Powell is a 78 y.o. female patient of Stacks, Cletus Gash, MD who had a Medicare Annual Wellness Visit today via telephone. Alexis Powell is Retired and lives with their daughter. she has 2 children. she reports that she is socially active and does interact with friends/family regularly. she is not physically active and enjoys gardening.  Patient Care Team: Claretta Fraise, MD as PCP - General (Family Medicine)  Advanced Directives 12/28/2018  Does Patient Have a Medical Advance Directive? No  Would patient like information on creating a medical advance directive? Yes (MAU/Ambulatory/Procedural Areas - Information given)    Hospital Utilization Over the Past 12 Months: # of hospitalizations or ER visits: 0 # of surgeries: 0  Review of Systems    Patient reports that her overall health is unchanged compared to last year.  Patient Reported Readings (BP, Pulse, CBG, Weight, etc) none  Review of Systems: History obtained from chart review and the patient General ROS: negative  All other systems negative.  Pain Assessment Pain : No/denies pain     Current Medications & Allergies (verified) Allergies as of 12/28/2018      Reactions   Morphine And Related Anaphylaxis   Penicillins Anaphylaxis   Phenol Anaphylaxis   Sulfa Antibiotics Rash   Iodine-131 Rash   IVP dye       Medication List       Accurate as of December 28, 2018  9:14 AM. If you have any questions, ask your nurse or doctor.        acetaminophen-codeine 300-30 MG tablet Commonly known as: TYLENOL #3 Take 1 tablet by mouth every 6 (six) hours as needed for moderate pain.   Altace 10 MG capsule Generic drug: ramipril TAKE 1 CAPSULE(10 MG) BY MOUTH DAILY   aspirin 81 MG chewable tablet Chew 81 mg by mouth daily.   carvedilol 12.5 MG tablet Commonly known as: Coreg TAKE 1/2 TABLET BY MOUTH EVERY MORNING AND 1 TABLET EVERY EVENING   diphenhydrAMINE 25 MG tablet Commonly known as: Benadryl Allergy Take 1 tablet (25 mg total) by mouth every 6 (six) hours as needed.   famotidine 20 MG tablet Commonly known as: PEPCID TAKE 1 TABLET(20 MG) BY MOUTH TWICE DAILY   furosemide 40 MG tablet Commonly known as: LASIX Take 2 tablets (80 mg total) by mouth daily. (Please make your 6 mos ckup)   isosorbide mononitrate 30 MG 24 hr tablet Commonly known as: IMDUR Take 1 tablet (30 mg total) by mouth daily. (Please make your 6 mos appt)   potassium chloride SA 20 MEQ tablet Commonly known as: K-DUR Take 2 tablets (40 mEq total) by mouth daily. (Please make your 6 month appt)   Trelegy Ellipta 100-62.5-25 MCG/INH Aepb Generic drug: Fluticasone-Umeclidin-Vilant       History (reviewed): Past Medical History:  Diagnosis Date  . Hyperlipidemia   . Hypertension   . Myocardial infarction [facility])    Past Surgical History:  Procedure Laterality Date  . ABDOMINAL  HYSTERECTOMY    . APPENDECTOMY    . CESAREAN SECTION    . CHOLECYSTECTOMY    . CORONARY ARTERY BYPASS GRAFT    . EYE SURGERY Bilateral    cataracts   Family History  Problem Relation Age of Onset  . Heart disease Father    Social History   Socioeconomic History  . Marital status: Divorced    Spouse name: Not on file  . Number of children: 2  . Years of education: Not on file  . Highest education level: Some college, no  degree  Occupational History  . Occupation: retired  Scientific laboratory technician  . Financial resource strain: Not hard at all  . Food insecurity    Worry: Never true    Inability: Never true  . Transportation needs    Medical: No    Non-medical: No  Tobacco Use  . Smoking status: Former Smoker    Quit date: 03/20/1999    Years since quitting: 19.7  . Smokeless tobacco: Never Used  Substance and Sexual Activity  . Alcohol use: No    Alcohol/week: 0.0 standard drinks  . Drug use: No  . Sexual activity: Not on file  Lifestyle  . Physical activity    Days per week: 0 days    Minutes per session: 0 min  . Stress: Not at all  Relationships  . Social connections    Talks on phone: More than three times a week    Gets together: More than three times a week    Attends religious service: More than 4 times per year    Active member of club or organization: Yes    Attends meetings of clubs or organizations: More than 4 times per year    Relationship status: Divorced  Other Topics Concern  . Not on file  Social History Narrative  . Not on file    Activities of Daily Living In your present state of health, do you have any difficulty performing the following activities: 12/28/2018  Hearing? N  Vision? N  Difficulty concentrating or making decisions? N  Walking or climbing stairs? N  Dressing or bathing? N  Doing errands, shopping? N  Preparing Food and eating ? N  Using the Toilet? N  In the past six months, have you accidently leaked urine? N  Do you have problems with loss of bowel control? N  Managing your Medications? N  Managing your Finances? N  Some recent data might be hidden    Patient Literacy How often do you need to have someone help you when you read instructions, pamphlets, or other written materials from your doctor or pharmacy?: 1 - Never What is the last grade level you completed in school?: Some College  Exercise Current Exercise Habits: The patient does not  participate in regular exercise at present, Exercise limited by: None identified  Diet Patient reports consuming 3 meals a day and 1 snack(s) a day Patient reports that her primary diet is: Regular Patient reports that she does have regular access to food.   Depression Screen PHQ 2/9 Scores 12/28/2018 04/16/2018 08/08/2017 12/05/2016 09/02/2016 08/09/2016 03/02/2016  PHQ - 2 Score 0 0 0 0 0 0 0     Fall Risk Fall Risk  12/28/2018 04/16/2018 08/08/2017 12/05/2016 09/02/2016  Falls in the past year? 0 No No No No     Objective:  Alexis Powell seemed alert and oriented and she participated appropriately during our telephone visit.  Blood Pressure Weight BMI  BP  Readings from Last 3 Encounters:  04/16/18 134/64  08/08/17 127/63  12/05/16 (!) 96/53   Wt Readings from Last 3 Encounters:  12/28/18 219 lb (99.3 kg)  04/16/18 219 lb (99.3 kg)  08/08/17 221 lb (100.2 kg)   BMI Readings from Last 1 Encounters:  12/28/18 41.38 kg/m    *Unable to obtain current vital signs, weight, and BMI due to telephone visit type  Hearing/Vision  . Alexis Powell did not seem to have difficulty with hearing/understanding during the telephone conversation . Reports that she has had a formal eye exam by an eye care professional within the past year . Reports that she has not had a formal hearing evaluation within the past year *Unable to fully assess hearing and vision during telephone visit type  Cognitive Function: 6CIT Screen 12/28/2018  What Year? 0 points  What month? 0 points  What time? 0 points  Count back from 20 0 points  Months in reverse 0 points  Repeat phrase 0 points  Total Score 0   (Normal:0-7, Significant for Dysfunction: >8)  Normal Cognitive Function Screening: Yes   Immunization & Health Maintenance Record Immunization History  Administered Date(s) Administered  . Pneumococcal Conjugate-13 05/04/2013    Health Maintenance  Topic Date Due  . TETANUS/TDAP  09/20/1957  . PNA vac Low Risk  Adult (2 of 2 - PPSV23) 05/04/2014  . INFLUENZA VACCINE  02/02/2019  . DEXA SCAN  Completed       Assessment  This is a routine wellness examination for [employer].  Health Maintenance: Due or Overdue Health Maintenance Due  Topic Date Due  . TETANUS/TDAP  09/20/1957  . PNA vac Low Risk Adult (2 of 2 - PPSV23) 05/04/2014    Alexis Powell does not need a referral for Community Assistance: Care Management:   no Social Work:    no Prescription Assistance:  no Nutrition/Diabetes Education:  no   Plan:  Personalized Goals Goals Addressed            This Visit's Progress   . Exercise 3x per week (30 min per time)       Try to exercise for at least 30 minutes, 3 times weekly      Personalized Health Maintenance & Screening Recommendations  Pneumococcal vaccine  Td vaccine Advanced directives: has NO advanced directive  - add't info requested. Referral to SW: no Shingrix  Lung Cancer Screening Recommended: no (Low Dose CT Chest recommended if Age 62-80 years, 30 pack-year currently smoking OR have quit w/in past 15 years) Hepatitis C Screening recommended: no HIV Screening recommended: no  Advanced Directives: Written information was prepared per patient's request.  Referrals & Orders No orders of the defined types were placed in this encounter.   Follow-up Plan . Follow-up with Claretta Fraise, MD as planned    I have personally reviewed and noted the following in the patient's chart:   . Medical and social history . Use of alcohol, tobacco or illicit drugs  . Current medications and supplements . Functional ability and status . Nutritional status . Physical activity . Advanced directives . List of other physicians . Hospitalizations, surgeries, and ER visits in previous 12 months . Vitals . Screenings to include cognitive, depression, and falls . Referrals and appointments  In addition, I have reviewed and discussed with Alexis Powell certain  preventive protocols, quality metrics, and best practice recommendations. A written personalized care plan for preventive services as well as general preventive health recommendations is available and can  be mailed to the patient at her request.      Alexis Heath, LPN  7/00/1749

## 2019-01-15 IMAGING — DX DG CHEST 2V
2 series · 2 of 2 positions shown · non-contrast
Comparison: Chest x-ray of 03/20/2015

CLINICAL DATA: Cough, congestion for 3 weeks

EXAM:
CHEST  2 VIEW

[chest pa]
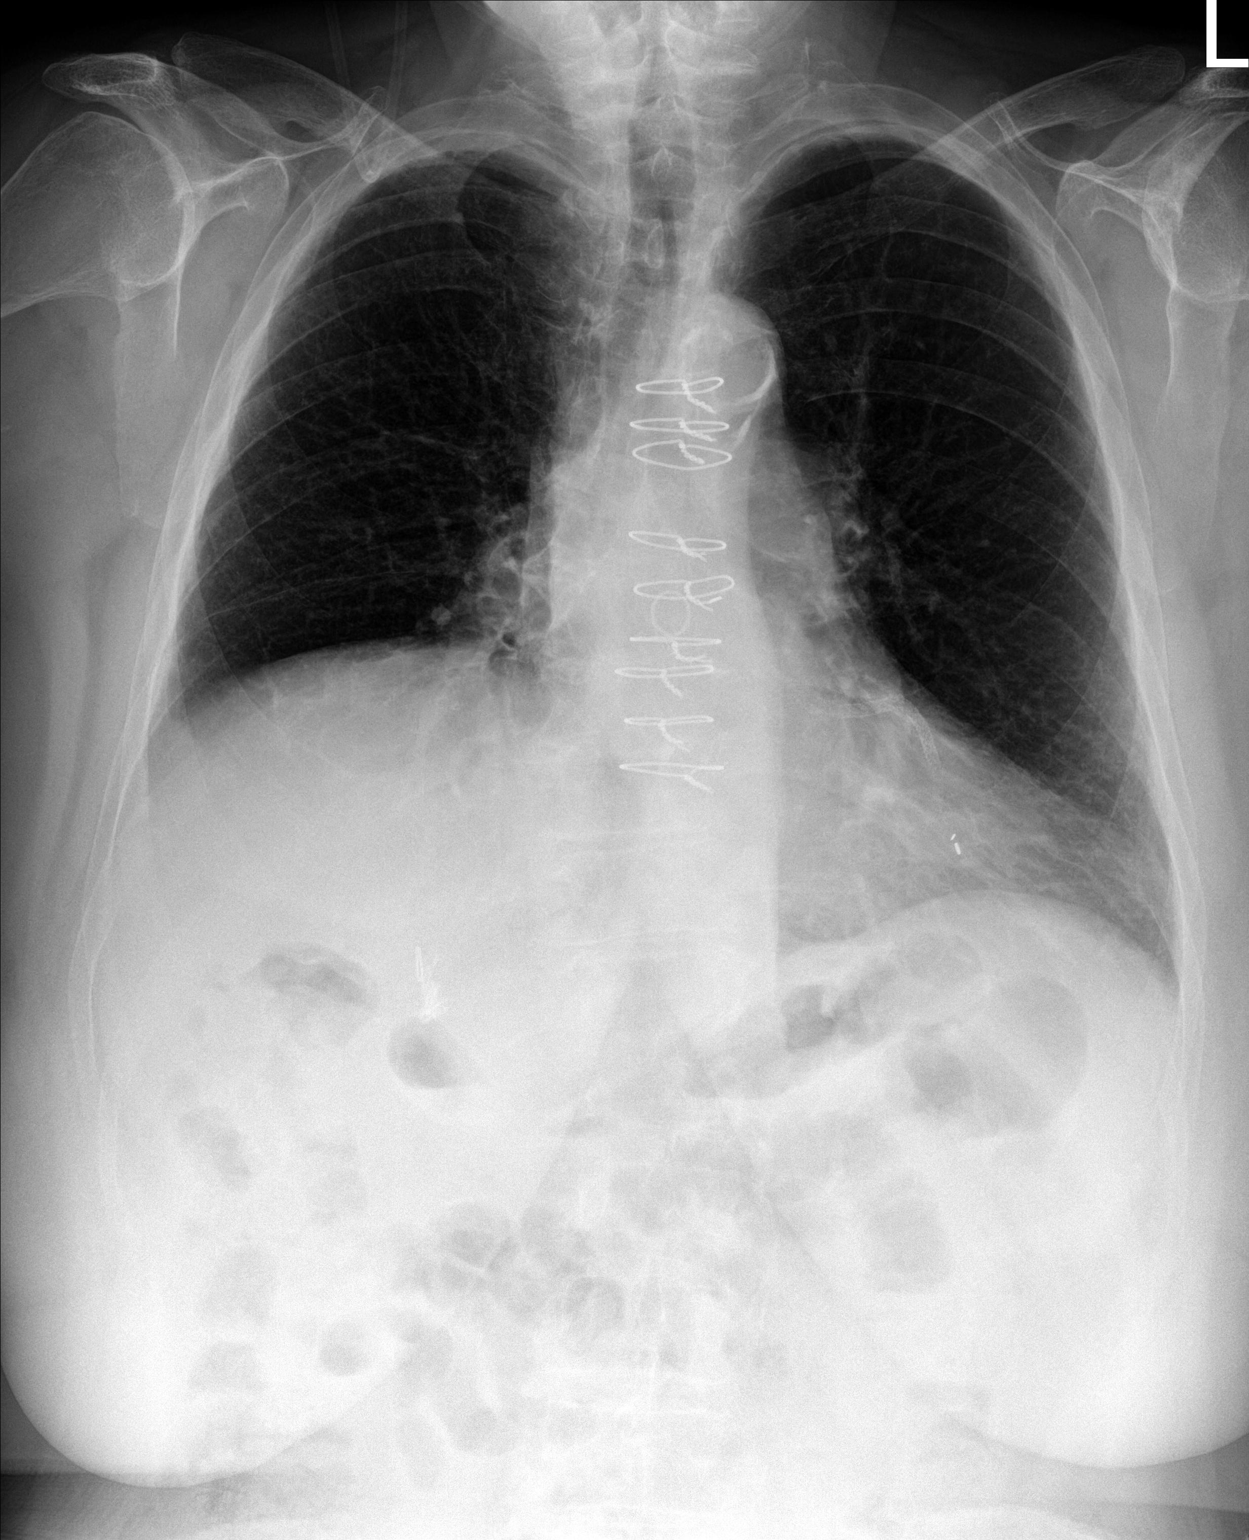

[chest lat]
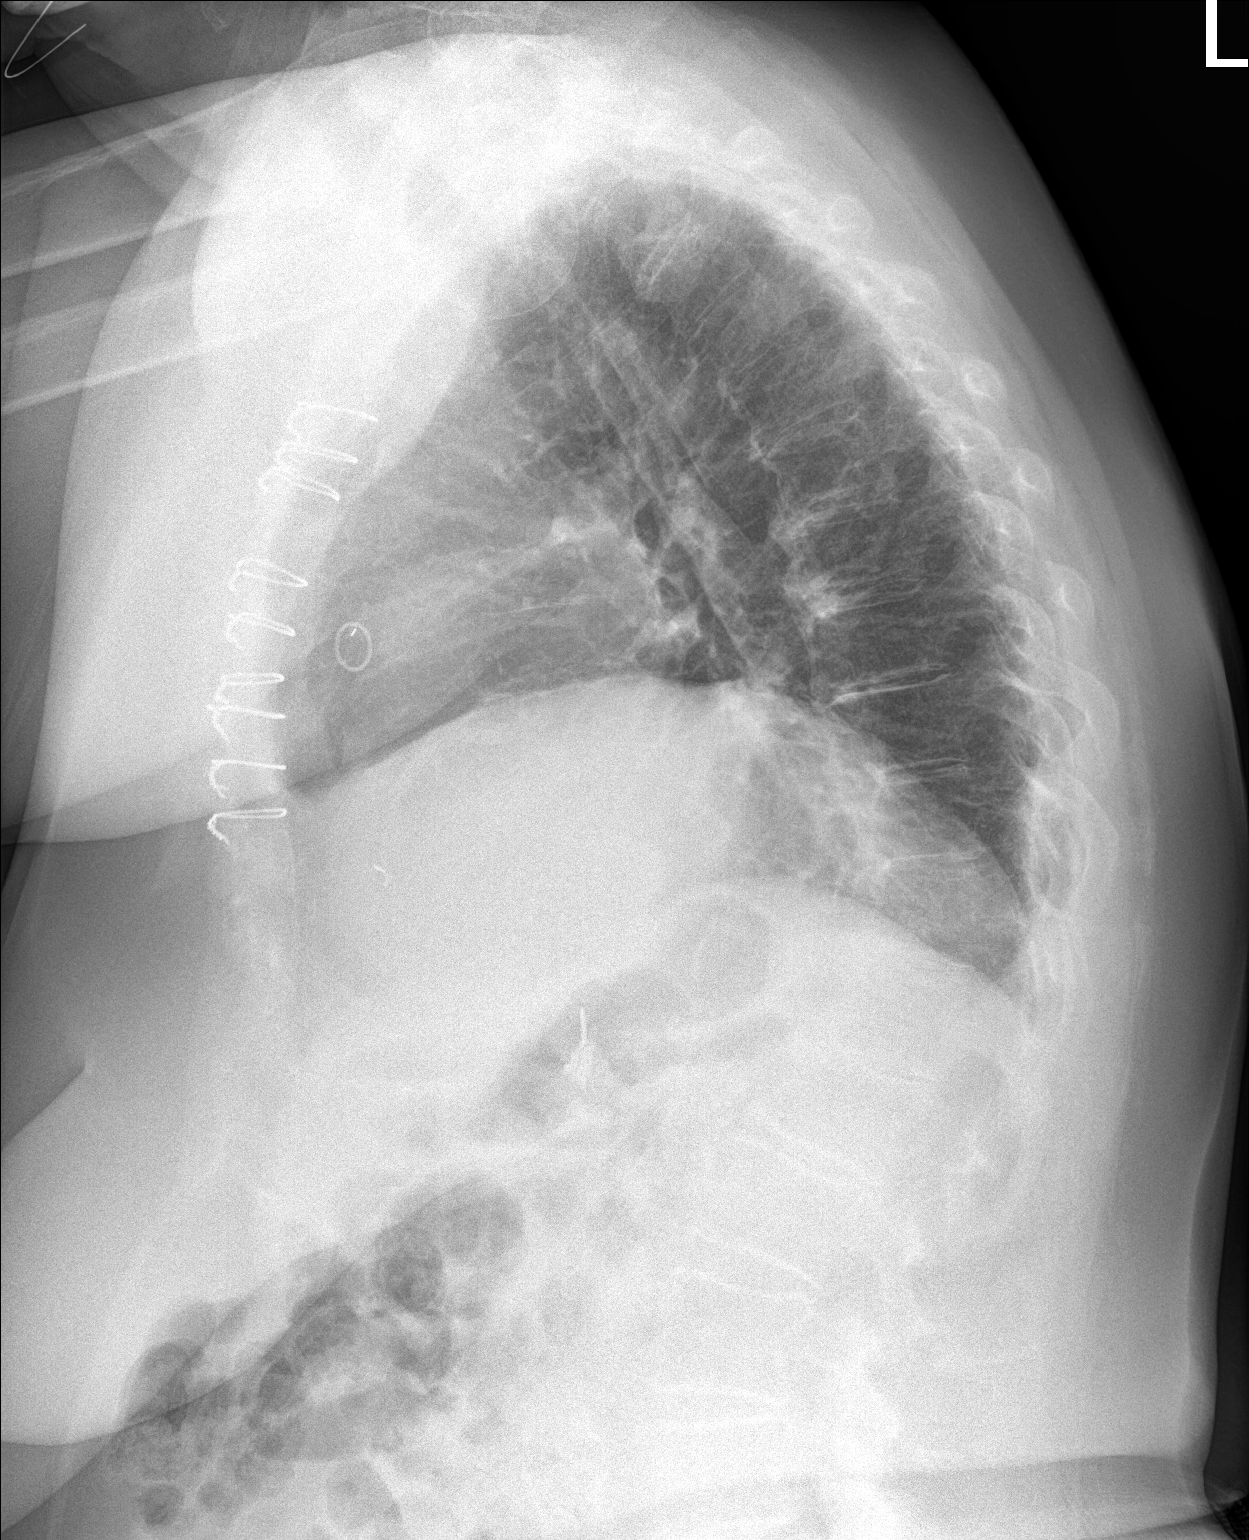

[2 of 2 positions shown; findings below may reference images not displayed]

FINDINGS: No active infiltrate or effusion is seen. Slight elevation of the
right hemidiaphragm remains. Mediastinal and hilar contours are
unremarkable. The heart is mildly enlarged and stable. Median
sternotomy sutures are present from prior CABG. There are
degenerative changes diffusely throughout the thoracic spine.
IMPRESSION: No active lung disease. Stable slight elevation of the right
hemidiaphragm. Stable mild cardiomegaly.

## 2019-02-12 ENCOUNTER — Other Ambulatory Visit: Payer: Self-pay | Admitting: Family Medicine

## 2019-02-25 DIAGNOSIS — Z951 Presence of aortocoronary bypass graft: Secondary | ICD-10-CM | POA: Diagnosis not present

## 2019-03-06 ENCOUNTER — Other Ambulatory Visit: Payer: Self-pay | Admitting: Family Medicine

## 2019-03-29 ENCOUNTER — Other Ambulatory Visit: Payer: Self-pay | Admitting: Family Medicine

## 2019-04-05 ENCOUNTER — Other Ambulatory Visit: Payer: Self-pay | Admitting: Family Medicine

## 2019-05-08 ENCOUNTER — Other Ambulatory Visit: Payer: Self-pay | Admitting: Family Medicine

## 2019-06-05 ENCOUNTER — Other Ambulatory Visit: Payer: Self-pay | Admitting: Family Medicine

## 2019-06-07 ENCOUNTER — Telehealth: Payer: Self-pay | Admitting: Family Medicine

## 2019-06-07 NOTE — Telephone Encounter (Signed)
Left patient detailed message that she needs to be seen that's why only 30 day supply was sent told to call office and schedule appt for med follow up with St[facility]

## 2019-06-07 NOTE — Telephone Encounter (Signed)
Pt scheduled with Dr Livia Snellen 07/10/18 at 10:10.

## 2019-06-10 ENCOUNTER — Encounter: Payer: Self-pay | Admitting: Family Medicine

## 2019-06-10 ENCOUNTER — Ambulatory Visit (INDEPENDENT_AMBULATORY_CARE_PROVIDER_SITE_OTHER): Payer: Medicare Other | Admitting: Family Medicine

## 2019-06-10 DIAGNOSIS — I10 Essential (primary) hypertension: Secondary | ICD-10-CM

## 2019-06-10 MED ORDER — ISOSORBIDE MONONITRATE ER 30 MG PO TB24: 30.0000 mg | ORAL_TABLET | Freq: Every day | ORAL | 3 refills | Status: AC

## 2019-06-10 MED ORDER — FUROSEMIDE 40 MG PO TABS: 80.0000 mg | ORAL_TABLET | Freq: Every day | ORAL | 3 refills | Status: DC

## 2019-06-10 MED ORDER — ALTACE 10 MG PO CAPS: 10.0000 mg | ORAL_CAPSULE | Freq: Every day | ORAL | 3 refills | Status: DC

## 2019-06-10 MED ORDER — POTASSIUM CHLORIDE CRYS ER 20 MEQ PO TBCR: 40.0000 meq | EXTENDED_RELEASE_TABLET | Freq: Every day | ORAL | 3 refills | Status: DC

## 2019-06-10 MED ORDER — CARVEDILOL 12.5 MG PO TABS: ORAL_TABLET | ORAL | 3 refills | Status: DC

## 2019-06-10 NOTE — Progress Notes (Signed)
Subjective:    Patient ID: Alexis Powell, female    DOB: [birth year], 78 y.o.   MRN: [id number]   HPI: Alexis Powell is a 78 y.o. female presenting for  presents for  follow-up of hypertension. Patient has no history of headache chest pain or shortness of breath or recent cough. Patient also denies symptoms of TIA such as focal numbness or weakness. Patient denies side effects from medication. States taking it regularly. Current 100/62. Been like that for a month. Pulse 72, Sat 99% on O2 at 3 liters. RA sat is 88%. TEmp 97.0 Weight 213. HEart murmur is acting up and Dr. Lamona Curl is watching it closely.   Heart murmur has flared says pt. Cardiology monitoring more closely.    Current regimen of meds seems to agree with her well. Getting brand name of Altace and Coreg. These work much better for her controlling her heart failure symptoms.   Continues using Trelegy inhaler for COPD. Pulse ox as noted above. She requires 3 liters O2 to maintain her sats.     Depression screen [facility] 2/9 12/28/2018 04/16/2018 08/08/2017 12/05/2016 09/02/2016  Decreased Interest 0 0 0 0 0  Down, Depressed, Hopeless 0 0 0 0 0  PHQ - 2 Score 0 0 0 0 0     Relevant past medical, surgical, family and social history reviewed and updated as indicated.  Interim medical history since our last visit reviewed. Allergies and medications reviewed and updated.  ROS:  Review of Systems  Constitutional: Negative.   HENT: Negative for congestion.   Eyes: Negative for visual disturbance.  Respiratory: Positive for cough and shortness of breath.   Cardiovascular: Negative for chest pain.  Gastrointestinal: Negative for abdominal pain, constipation, diarrhea, nausea and vomiting.  Musculoskeletal: Positive for arthralgias. Negative for myalgias.  Neurological: Negative for headaches.  Psychiatric/Behavioral: Negative for sleep disturbance.     Social History   Tobacco Use  Smoking Status Former Smoker  . Quit date:  03/20/1999  . Years since quitting: 20.2  Smokeless Tobacco Never Used       Objective:     Wt Readings from Last 3 Encounters:  12/28/18 219 lb (99.3 kg)  04/16/18 219 lb (99.3 kg)  08/08/17 221 lb (100.2 kg)     Exam deferred. Pt. Harboring due to COVID 19. Phone visit performed.   Assessment & Plan:   1. Essential hypertension     Meds ordered this encounter  Medications  . potassium chloride SA (KLOR-CON) 20 MEQ tablet    Sig: Take 2 tablets (40 mEq total) by mouth daily. (Needs to be seen before next refill)    Dispense:  180 tablet    Refill:  3  . ALTACE 10 MG capsule    Sig: Take 1 capsule (10 mg total) by mouth daily.    Dispense:  90 capsule    Refill:  3  . carvedilol (COREG) 12.5 MG tablet    Sig: TAKE 1/2 TABLET BY MOUTH EVERY MORNING AND 1 TABLET EVERY EVENING    Dispense:  135 tablet    Refill:  3  . furosemide (LASIX) 40 MG tablet    Sig: Take 2 tablets (80 mg total) by mouth daily.    Dispense:  180 tablet    Refill:  3  . isosorbide mononitrate (IMDUR) 30 MG 24 hr tablet    Sig: Take 1 tablet (30 mg total) by mouth daily. (Needs to be seen before next refill)    Dispense:  90 tablet    Refill:  3    No orders of the defined types were placed in this encounter.     Diagnoses and all orders for this visit:  Essential hypertension  Other orders -     potassium chloride SA (KLOR-CON) 20 MEQ tablet; Take 2 tablets (40 mEq total) by mouth daily. (Needs to be seen before next refill) -     ALTACE 10 MG capsule; Take 1 capsule (10 mg total) by mouth daily. -     carvedilol (COREG) 12.5 MG tablet; TAKE 1/2 TABLET BY MOUTH EVERY MORNING AND 1 TABLET EVERY EVENING -     furosemide (LASIX) 40 MG tablet; Take 2 tablets (80 mg total) by mouth daily. -     isosorbide mononitrate (IMDUR) 30 MG 24 hr tablet; Take 1 tablet (30 mg total) by mouth daily. (Needs to be seen before next refill)    Virtual Visit via telephone Note  I discussed the  limitations, risks, security and privacy concerns of performing an evaluation and management service by telephone and the availability of in person appointments. The patient was identified with two identifiers. Pt.expressed understanding and agreed to proceed. Pt. Is at home. Dr. Livia Snellen is in his office.  Follow Up Instructions:   I discussed the assessment and treatment plan with the patient. The patient was provided an opportunity to ask questions and all were answered. The patient agreed with the plan and demonstrated an understanding of the instructions.   The patient was advised to call back or seek an in-person evaluation if the symptoms worsen or if the condition fails to improve as anticipated.   Total minutes including chart review and phone contact time: 18   Follow up plan: Return in about 6 months (around 12/09/2019), or if symptoms worsen or fail to improve.  Claretta Fraise, MD [place]

## 2019-06-12 DIAGNOSIS — R079 Chest pain, unspecified: Secondary | ICD-10-CM | POA: Diagnosis not present

## 2019-06-13 DIAGNOSIS — R079 Chest pain, unspecified: Secondary | ICD-10-CM | POA: Diagnosis not present

## 2019-06-29 ENCOUNTER — Other Ambulatory Visit: Payer: Self-pay | Admitting: Family Medicine

## 2019-07-24 DIAGNOSIS — I10 Essential (primary) hypertension: Secondary | ICD-10-CM | POA: Diagnosis not present

## 2019-07-24 DIAGNOSIS — E785 Hyperlipidemia, unspecified: Secondary | ICD-10-CM | POA: Diagnosis not present

## 2019-07-24 DIAGNOSIS — I5022 Chronic systolic (congestive) heart failure: Secondary | ICD-10-CM | POA: Diagnosis not present

## 2019-07-24 DIAGNOSIS — I350 Nonrheumatic aortic (valve) stenosis: Secondary | ICD-10-CM | POA: Diagnosis not present

## 2019-07-24 DIAGNOSIS — I2510 Atherosclerotic heart disease of native coronary artery without angina pectoris: Secondary | ICD-10-CM | POA: Diagnosis not present

## 2019-07-24 DIAGNOSIS — R0789 Other chest pain: Secondary | ICD-10-CM | POA: Diagnosis not present

## 2019-07-24 DIAGNOSIS — E669 Obesity, unspecified: Secondary | ICD-10-CM | POA: Diagnosis not present

## 2019-07-24 DIAGNOSIS — I428 Other cardiomyopathies: Secondary | ICD-10-CM | POA: Diagnosis not present

## 2019-07-24 DIAGNOSIS — R079 Chest pain, unspecified: Secondary | ICD-10-CM | POA: Diagnosis not present

## 2019-07-24 DIAGNOSIS — I959 Hypotension, unspecified: Secondary | ICD-10-CM | POA: Diagnosis not present

## 2019-07-24 DIAGNOSIS — Z20822 Contact with and (suspected) exposure to covid-19: Secondary | ICD-10-CM | POA: Diagnosis not present

## 2019-07-24 DIAGNOSIS — I110 Hypertensive heart disease with heart failure: Secondary | ICD-10-CM | POA: Diagnosis not present

## 2019-07-25 DIAGNOSIS — Z951 Presence of aortocoronary bypass graft: Secondary | ICD-10-CM | POA: Diagnosis not present

## 2019-07-25 DIAGNOSIS — R079 Chest pain, unspecified: Secondary | ICD-10-CM | POA: Diagnosis not present

## 2019-07-25 DIAGNOSIS — I240 Acute coronary thrombosis not resulting in myocardial infarction: Secondary | ICD-10-CM | POA: Diagnosis not present

## 2019-07-26 DIAGNOSIS — Z9981 Dependence on supplemental oxygen: Secondary | ICD-10-CM | POA: Diagnosis not present

## 2019-07-26 DIAGNOSIS — I2510 Atherosclerotic heart disease of native coronary artery without angina pectoris: Secondary | ICD-10-CM | POA: Diagnosis present

## 2019-07-26 DIAGNOSIS — Z8249 Family history of ischemic heart disease and other diseases of the circulatory system: Secondary | ICD-10-CM | POA: Diagnosis not present

## 2019-07-26 DIAGNOSIS — I428 Other cardiomyopathies: Secondary | ICD-10-CM | POA: Diagnosis present

## 2019-07-26 DIAGNOSIS — Z20822 Contact with and (suspected) exposure to covid-19: Secondary | ICD-10-CM | POA: Diagnosis present

## 2019-07-26 DIAGNOSIS — E785 Hyperlipidemia, unspecified: Secondary | ICD-10-CM | POA: Diagnosis present

## 2019-07-26 DIAGNOSIS — Z87891 Personal history of nicotine dependence: Secondary | ICD-10-CM | POA: Diagnosis not present

## 2019-07-26 DIAGNOSIS — I240 Acute coronary thrombosis not resulting in myocardial infarction: Secondary | ICD-10-CM | POA: Diagnosis not present

## 2019-07-26 DIAGNOSIS — R079 Chest pain, unspecified: Secondary | ICD-10-CM | POA: Diagnosis not present

## 2019-07-26 DIAGNOSIS — Z79899 Other long term (current) drug therapy: Secondary | ICD-10-CM | POA: Diagnosis not present

## 2019-07-26 DIAGNOSIS — I5022 Chronic systolic (congestive) heart failure: Secondary | ICD-10-CM | POA: Diagnosis present

## 2019-07-26 DIAGNOSIS — J449 Chronic obstructive pulmonary disease, unspecified: Secondary | ICD-10-CM | POA: Diagnosis present

## 2019-07-26 DIAGNOSIS — Z91041 Radiographic dye allergy status: Secondary | ICD-10-CM | POA: Diagnosis not present

## 2019-07-26 DIAGNOSIS — Z6837 Body mass index (BMI) 37.0-37.9, adult: Secondary | ICD-10-CM | POA: Diagnosis not present

## 2019-07-26 DIAGNOSIS — Z7982 Long term (current) use of aspirin: Secondary | ICD-10-CM | POA: Diagnosis not present

## 2019-07-26 DIAGNOSIS — I350 Nonrheumatic aortic (valve) stenosis: Secondary | ICD-10-CM | POA: Diagnosis present

## 2019-07-26 DIAGNOSIS — I517 Cardiomegaly: Secondary | ICD-10-CM | POA: Diagnosis not present

## 2019-07-26 DIAGNOSIS — Z951 Presence of aortocoronary bypass graft: Secondary | ICD-10-CM | POA: Diagnosis not present

## 2019-07-26 DIAGNOSIS — I253 Aneurysm of heart: Secondary | ICD-10-CM | POA: Diagnosis not present

## 2019-07-26 DIAGNOSIS — E669 Obesity, unspecified: Secondary | ICD-10-CM | POA: Diagnosis present

## 2019-07-26 DIAGNOSIS — I252 Old myocardial infarction: Secondary | ICD-10-CM | POA: Diagnosis not present

## 2019-07-26 DIAGNOSIS — R0902 Hypoxemia: Secondary | ICD-10-CM | POA: Diagnosis present

## 2019-07-26 DIAGNOSIS — I110 Hypertensive heart disease with heart failure: Secondary | ICD-10-CM | POA: Diagnosis present

## 2019-07-27 MED ORDER — UMECLIDINIUM-VILANTEROL 62.5-25 MCG/INH IN AEPB
1.00 | INHALATION_SPRAY | RESPIRATORY_TRACT | Status: DC
Start: 2019-07-27 — End: 2019-07-27

## 2019-07-27 MED ORDER — GENERIC EXTERNAL MEDICATION
Status: DC
Start: ? — End: 2019-07-27

## 2019-07-27 MED ORDER — ASPIRIN 81 MG PO CHEW
81.00 | CHEWABLE_TABLET | ORAL | Status: DC
Start: 2019-07-27 — End: 2019-07-27

## 2019-07-27 MED ORDER — CARVEDILOL 6.25 MG PO TABS
12.50 | ORAL_TABLET | ORAL | Status: DC
Start: 2019-07-26 — End: 2019-07-27

## 2019-07-27 MED ORDER — ISOSORBIDE MONONITRATE ER 30 MG PO TB24
30.00 | ORAL_TABLET | ORAL | Status: DC
Start: 2019-07-27 — End: 2019-07-27

## 2019-07-27 MED ORDER — CARVEDILOL 6.25 MG PO TABS
6.25 | ORAL_TABLET | ORAL | Status: DC
Start: 2019-07-27 — End: 2019-07-27

## 2019-07-27 MED ORDER — NITROGLYCERIN 0.4 MG SL SUBL
0.40 | SUBLINGUAL_TABLET | SUBLINGUAL | Status: DC
Start: ? — End: 2019-07-27

## 2019-07-27 MED ORDER — SODIUM CHLORIDE 0.9 % IV SOLN
10.00 | INTRAVENOUS | Status: DC
Start: ? — End: 2019-07-27

## 2019-07-27 MED ORDER — HYDROCORTISONE NA SUCCINATE PF 100 MG IJ SOLR
200.00 | INTRAMUSCULAR | Status: DC
Start: 2019-07-26 — End: 2019-07-27

## 2019-07-27 MED ORDER — FAMOTIDINE 20 MG PO TABS
20.00 | ORAL_TABLET | ORAL | Status: DC
Start: 2019-07-27 — End: 2019-07-27

## 2019-07-27 MED ORDER — ENOXAPARIN SODIUM 40 MG/0.4ML ~~LOC~~ SOLN
40.00 | SUBCUTANEOUS | Status: DC
Start: 2019-07-27 — End: 2019-07-27

## 2019-07-27 MED ORDER — ACETAMINOPHEN 500 MG PO TABS
1000.00 | ORAL_TABLET | ORAL | Status: DC
Start: 2019-07-26 — End: 2019-07-27

## 2019-07-27 MED ORDER — POTASSIUM BICARB-CITRIC ACID 10 MEQ PO TBEF
40.00 | EFFERVESCENT_TABLET | ORAL | Status: DC
Start: 2019-07-27 — End: 2019-07-27

## 2019-07-27 MED ORDER — HYDROCODONE-ACETAMINOPHEN 5-325 MG PO TABS
1.00 | ORAL_TABLET | ORAL | Status: DC
Start: ? — End: 2019-07-27

## 2019-07-27 MED ORDER — SODIUM CHLORIDE 0.9 % IV SOLN
60.00 | INTRAVENOUS | Status: DC
Start: ? — End: 2019-07-27

## 2019-07-27 MED ORDER — LABETALOL HCL 5 MG/ML IV SOLN
10.00 | INTRAVENOUS | Status: DC
Start: ? — End: 2019-07-27

## 2019-07-27 MED ORDER — ALPRAZOLAM 0.25 MG PO TABS
0.25 | ORAL_TABLET | ORAL | Status: DC
Start: ? — End: 2019-07-27

## 2019-07-27 MED ORDER — DIPHENHYDRAMINE HCL 25 MG PO CAPS
25.00 | ORAL_CAPSULE | ORAL | Status: DC
Start: ? — End: 2019-07-27

## 2019-07-29 ENCOUNTER — Ambulatory Visit (INDEPENDENT_AMBULATORY_CARE_PROVIDER_SITE_OTHER): Payer: Medicare Other | Admitting: Family Medicine

## 2019-07-29 ENCOUNTER — Encounter: Payer: Self-pay | Admitting: Family Medicine

## 2019-07-29 DIAGNOSIS — J449 Chronic obstructive pulmonary disease, unspecified: Secondary | ICD-10-CM | POA: Diagnosis not present

## 2019-07-29 DIAGNOSIS — I420 Dilated cardiomyopathy: Secondary | ICD-10-CM

## 2019-07-29 NOTE — Progress Notes (Signed)
Subjective:    Patient ID: Alexis Powell, female    DOB: February 17, 1941, 79 y.o.   MRN: [id number]   HPI: Alexis Powell is a 79 y.o. female presenting for follow up of chest pain episode. Last week admitted to [facility] for chest pain. Had heart cath that showed no blockages. Had elevated D Dimer but no PE. On CTPA.  She had an echocardiogram which showed fairly good left ventricular ejection fraction.  At time of discharge she was told to discontinue her Altace and take Ranexa instead.  After reading about it she is concerned about the change.  98/58 to 108/64. Sats 95-98% on 2-3 liters (3 with activity) HR 54-62.   Pt. Afraid of Ranexa. Not sure why cardiology wants her to take it since the heart cath showed clear coronaries.  She would prefer to discontinue the Ranexa and go back to the Altace.  Altace is worked well for her for a couple of decades.  She has more confidence in the drug.  It also is more useful in the event of congestive heart failure.  Her blood pressure is noted above to be on the low end of normal.  This is usual for her.  She will monitor this closely back on the Altace.  Also of note is that she continues to use Trelegy inhaler for her COPD.  She wears 2 L of oxygen daily when at rest.  She will increase that to 3 L when she is active.  Sats have been good at 95 to 98% recently.  This in spite of the recent episode of chest pain.  She has had no chest pain since returning home from the hospital.   Depression screen [facility] 2/9 12/28/2018 04/16/2018 08/08/2017 12/05/2016 09/02/2016  Decreased Interest 0 0 0 0 0  Down, Depressed, Hopeless 0 0 0 0 0  PHQ - 2 Score 0 0 0 0 0     Relevant past medical, surgical, family and social history reviewed and updated as indicated.  Interim medical history since our last visit reviewed. Allergies and medications reviewed and updated.  ROS:  Review of Systems  Constitutional: Negative.   HENT: Negative for congestion.   Eyes: Negative for  visual disturbance.  Respiratory: Negative for shortness of breath (O2 dependent. Using Trelegy and currently stable. ).   Cardiovascular: Positive for chest pain (see HPI).  Gastrointestinal: Negative for abdominal pain, constipation, diarrhea, nausea and vomiting.  Genitourinary: Negative for difficulty urinating.  Musculoskeletal: Negative for arthralgias and myalgias.  Neurological: Negative for headaches.  Psychiatric/Behavioral: Negative for sleep disturbance.     Social History   Tobacco Use  Smoking Status Former Smoker  . Quit date: 03/20/1999  . Years since quitting: 20.3  Smokeless Tobacco Never Used       Objective:     Wt Readings from Last 3 Encounters:  12/28/18 219 lb (99.3 kg)  04/16/18 219 lb (99.3 kg)  08/08/17 221 lb (100.2 kg)     Exam deferred. Pt. Harboring due to COVID 19. Phone visit performed.   Assessment & Plan:   1. 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   No orders of the defined types were placed in this encounter.     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  However, she seems determined to do this and the case she makes for it in HPI above does make sense.  Since Ranexa is limited to use for angina and she was told she does not have angina that is her coronaries are normal, she would like to return to the Altace.  We know she has had history of congestive heart failure and t Altace would be more useful for the heart failure.  Additionally she has her isosorbide and Coreg with antianginal effects.  She will continue those as is and report chest pain is it occurs.  Otherwise follow-up is usual in about 2 to 3 months.  Records of [facility] stay at [facility] were reviewed through care everywhere.  Virtual Visit  via telephone Note  I discussed the limitations, risks, security and privacy concerns of performing an evaluation and management service by telephone and the availability of in person appointments. The patient was identified with two identifiers. Pt.expressed understanding and agreed to proceed. Pt. Is at home. Dr. Livia Snellen is in his office.  Follow Up Instructions:   I discussed the assessment and treatment plan with the patient. The patient was provided an opportunity to ask questions and all were answered. The patient agreed with the plan and demonstrated an understanding of the instructions.   The patient was advised to call back or seek an in-person evaluation if the symptoms worsen or if the condition fails to improve as anticipated.   Total minutes including chart review and phone contact time: 34   Follow up plan: Return in about 3 months (around 10/27/2019).  Alexis Fraise, MD [place]

## 2019-08-07 ENCOUNTER — Other Ambulatory Visit: Payer: Self-pay | Admitting: Family Medicine

## 2019-08-08 ENCOUNTER — Other Ambulatory Visit: Payer: Self-pay | Admitting: Family Medicine

## 2019-08-12 DIAGNOSIS — J449 Chronic obstructive pulmonary disease, unspecified: Secondary | ICD-10-CM | POA: Diagnosis not present

## 2019-08-12 DIAGNOSIS — I428 Other cardiomyopathies: Secondary | ICD-10-CM | POA: Diagnosis not present

## 2019-08-12 DIAGNOSIS — R0902 Hypoxemia: Secondary | ICD-10-CM | POA: Diagnosis not present

## 2019-08-13 ENCOUNTER — Telehealth: Payer: Self-pay | Admitting: Family Medicine

## 2019-08-13 NOTE — Chronic Care Management (AMB) (Signed)
  Chronic Care Management   Note  08/13/2019 Name: Alexis Powell MRN: [id number] DOB: [birth year]  Alexis Powell is a 79 y.o. year old female who is a primary care patient of Stacks, Cletus Gash, MD. I reached out to [employer] by phone today in response to a referral sent by Ms. [place] health plan.     Ms. Somoza was given information about Chronic Care Management services today including:  1. CCM service includes personalized support from designated clinical staff supervised by her physician, including individualized plan of care and coordination with other care providers 2. 24/7 contact phone numbers for assistance for urgent and routine care needs. 3. Service will only be billed when office clinical staff spend 20 minutes or more in a month to coordinate care. 4. Only one practitioner may furnish and bill the service in a calendar month. 5. The patient may stop CCM services at any time (effective at the end of the month) by phone call to the office staff. 6. The patient will be responsible for cost sharing (co-pay) of up to 20% of the service fee (after annual deductible is met).  Patient did not agree to enrollment in care management services and does not wish to consider at this time.  Follow up plan: The patient has been provided with contact information for the care management team and has been advised to call with any health related questions or concerns.   Noreene Larsson, [place], [place],  [address] Direct Dial: [phone] [email or web address] Website: [email or web address]

## 2019-08-27 DIAGNOSIS — Z23 Encounter for immunization: Secondary | ICD-10-CM | POA: Diagnosis not present

## 2019-09-19 DIAGNOSIS — Z23 Encounter for immunization: Secondary | ICD-10-CM | POA: Diagnosis not present

## 2019-10-31 DIAGNOSIS — E785 Hyperlipidemia, unspecified: Secondary | ICD-10-CM | POA: Diagnosis not present

## 2019-10-31 DIAGNOSIS — I5022 Chronic systolic (congestive) heart failure: Secondary | ICD-10-CM | POA: Diagnosis not present

## 2019-10-31 DIAGNOSIS — I2510 Atherosclerotic heart disease of native coronary artery without angina pectoris: Secondary | ICD-10-CM | POA: Diagnosis not present

## 2019-10-31 DIAGNOSIS — I350 Nonrheumatic aortic (valve) stenosis: Secondary | ICD-10-CM | POA: Diagnosis not present

## 2020-02-24 DIAGNOSIS — Z888 Allergy status to other drugs, medicaments and biological substances status: Secondary | ICD-10-CM | POA: Diagnosis not present

## 2020-02-24 DIAGNOSIS — J208 Acute bronchitis due to other specified organisms: Secondary | ICD-10-CM | POA: Diagnosis not present

## 2020-02-24 DIAGNOSIS — R0602 Shortness of breath: Secondary | ICD-10-CM | POA: Diagnosis not present

## 2020-02-24 DIAGNOSIS — J986 Disorders of diaphragm: Secondary | ICD-10-CM | POA: Diagnosis not present

## 2020-02-24 DIAGNOSIS — B9689 Other specified bacterial agents as the cause of diseases classified elsewhere: Secondary | ICD-10-CM | POA: Diagnosis not present

## 2020-02-24 DIAGNOSIS — Z20822 Contact with and (suspected) exposure to covid-19: Secondary | ICD-10-CM | POA: Diagnosis not present

## 2020-02-24 DIAGNOSIS — Z886 Allergy status to analgesic agent status: Secondary | ICD-10-CM | POA: Diagnosis not present

## 2020-02-24 DIAGNOSIS — J189 Pneumonia, unspecified organism: Secondary | ICD-10-CM | POA: Diagnosis not present

## 2020-02-24 DIAGNOSIS — I517 Cardiomegaly: Secondary | ICD-10-CM | POA: Diagnosis not present

## 2020-02-24 DIAGNOSIS — Z880 Allergy status to penicillin: Secondary | ICD-10-CM | POA: Diagnosis not present

## 2020-02-24 DIAGNOSIS — Z79899 Other long term (current) drug therapy: Secondary | ICD-10-CM | POA: Diagnosis not present

## 2020-02-24 DIAGNOSIS — R0902 Hypoxemia: Secondary | ICD-10-CM | POA: Diagnosis not present

## 2020-02-24 DIAGNOSIS — F17210 Nicotine dependence, cigarettes, uncomplicated: Secondary | ICD-10-CM | POA: Diagnosis not present

## 2020-02-24 DIAGNOSIS — Z882 Allergy status to sulfonamides status: Secondary | ICD-10-CM | POA: Diagnosis not present

## 2020-02-25 ENCOUNTER — Telehealth: Payer: Self-pay | Admitting: Family Medicine

## 2020-02-25 DIAGNOSIS — R0602 Shortness of breath: Secondary | ICD-10-CM | POA: Diagnosis not present

## 2020-02-25 DIAGNOSIS — J208 Acute bronchitis due to other specified organisms: Secondary | ICD-10-CM | POA: Diagnosis not present

## 2020-02-25 NOTE — Telephone Encounter (Signed)
APPT made in open spot for tomorrow for televisit

## 2020-02-26 ENCOUNTER — Ambulatory Visit (INDEPENDENT_AMBULATORY_CARE_PROVIDER_SITE_OTHER): Payer: Medicare Other | Admitting: Family Medicine

## 2020-02-26 ENCOUNTER — Other Ambulatory Visit: Payer: Self-pay

## 2020-02-26 ENCOUNTER — Encounter: Payer: Self-pay | Admitting: Family Medicine

## 2020-02-26 DIAGNOSIS — J441 Chronic obstructive pulmonary disease with (acute) exacerbation: Secondary | ICD-10-CM | POA: Diagnosis not present

## 2020-02-26 DIAGNOSIS — R0602 Shortness of breath: Secondary | ICD-10-CM

## 2020-02-26 DIAGNOSIS — J449 Chronic obstructive pulmonary disease, unspecified: Secondary | ICD-10-CM

## 2020-02-26 DIAGNOSIS — I420 Dilated cardiomyopathy: Secondary | ICD-10-CM | POA: Diagnosis not present

## 2020-02-26 MED ORDER — LEVOFLOXACIN 500 MG PO TABS: 500.0000 mg | ORAL_TABLET | Freq: Every day | ORAL | 0 refills | Status: DC

## 2020-02-26 NOTE — Progress Notes (Signed)
Subjective:    Patient ID: Alexis Powell, female    DOB: Feb 27, 1941, 79 y.o.   MRN: [id number]   HPI: Alexis Powell is a 79 y.o. female presenting for pulse ox went to 77 and temp went to 101. Has respiratory bacterial bronchitis. Got it from her daughter with whom she lives. Went to E.D. 2 days ago. Got a shot of cortisone and started a zpack. Also given a cough med, perles. Coughing white sputum with green in it.    Still coughing, but has improved. Put O2 to 4 liters for a while now at 3. When she has a coughing spell it drops to 80s, but goes back up [place] after. No more fever.   Vitals at home this AM: T 97.6, BP 127/65, HR 67, Sat 92% on 3 liters [place] Wt. 205. Now sat is 95% , 3L  Depression screen [facility] 2/9 12/28/2018 04/16/2018 08/08/2017 12/05/2016 09/02/2016  Decreased Interest 0 0 0 0 0  Down, Depressed, Hopeless 0 0 0 0 0  PHQ - 2 Score 0 0 0 0 0     Relevant past medical, surgical, family and social history reviewed and updated as indicated.  Interim medical history since our last visit reviewed. Allergies and medications reviewed and updated.  ROS:  Review of Systems  Constitutional: Positive for activity change, fatigue and fever.  HENT: Positive for congestion.   Respiratory: Positive for cough, chest tightness, shortness of breath and wheezing.   Cardiovascular: Negative for chest pain and palpitations.  Genitourinary: Negative.   Musculoskeletal: Negative.   Neurological: Negative.   Psychiatric/Behavioral: Negative for agitation, confusion and dysphoric mood. The patient is not nervous/anxious.      Social History   Tobacco Use  Smoking Status Former Smoker  . Quit date: 03/20/1999  . Years since quitting: 20.9  Smokeless Tobacco Never Used       Objective:     Wt Readings from Last 3 Encounters:  12/28/18 219 lb (99.3 kg)  04/16/18 219 lb (99.3 kg)  08/08/17 221 lb (100.2 kg)     Exam deferred. Pt. Harboring due to COVID 19. Phone visit  performed.   Assessment & Plan:   1. Acute exacerbation of chronic obstructive pulmonary disease (COPD) ([place])   2. COPD mixed type ([place])   3. 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 Shortness of breath     Meds ordered this encounter  Medications  . levofloxacin (LEVAQUIN) 500 MG tablet    Sig: Take 1 tablet (500 mg total) by mouth daily.    Dispense:  7 tablet    Refill:  0    No orders of the defined types were placed in this encounter.     Diagnoses and all orders for this visit:  Acute exacerbation of chronic obstructive pulmonary disease (COPD) ([place])  COPD mixed type (HCC)  Congestive cardiomyopathy (HCC)  Shortness of breath  Other orders -     levofloxacin (LEVAQUIN) 500 MG tablet; Take 1 tablet (500 mg total) by mouth daily.  Continue other medications as prescribed in the emergency room and your usual maintenance medicines as well.  Virtual Visit via telephone Note  I discussed the limitations, risks, security and privacy concerns of performing an evaluation and management service by telephone and the availability of in person appointments. The patient was identified with two identifiers. Pt.expressed understanding and agreed to proceed. Pt. Is at home. Dr. Livia Snellen is in his office.  Follow Up Instructions:   I discussed the  assessment and treatment plan with the patient. The patient was provided an opportunity to ask questions and all were answered. The patient agreed with the plan and demonstrated an understanding of the instructions.   The patient was advised to call back or seek an in-person evaluation if the symptoms worsen or if the condition fails to improve as anticipated.   Total minutes including chart review and phone contact time: 24   Follow up plan: Return if symptoms worsen or fail to improve.  Claretta Fraise, MD [place]

## 2020-03-19 ENCOUNTER — Telehealth: Payer: Self-pay | Admitting: Family Medicine

## 2020-03-20 MED ORDER — DOXYCYCLINE HYCLATE 100 MG PO TABS: 100.0000 mg | ORAL_TABLET | Freq: Two times a day (BID) | ORAL | 0 refills | Status: DC

## 2020-03-20 NOTE — Telephone Encounter (Signed)
Please let the patient know that I sent doxycycline, she can stop the Levaquin and take the doxycycline instead.

## 2020-03-20 NOTE — Telephone Encounter (Signed)
Patient aware and verbalized understanding. °

## 2020-03-25 ENCOUNTER — Telehealth: Payer: Self-pay | Admitting: Family Medicine

## 2020-03-25 NOTE — Telephone Encounter (Signed)
Pt states she has been having diarrhea and some visual disturbances and headache which she thinks is associated with the Doxycycline and she has been on it for 5 days but is feeling much better with her COPD exacerbation so she wants to know if she can just stay off the antibiotics and see what happens.

## 2020-03-25 NOTE — Telephone Encounter (Signed)
Pt aware and if symptoms worsen she will call

## 2020-03-25 NOTE — Telephone Encounter (Signed)
Pt says that she wants to talk to [facility] about doxycycline (VIBRA-TABS) 100 MG tablet. I asked wants was the message and she said she is having issues with the medication. I asked what the issues were and she said she would let him know. Please call back

## 2020-03-25 NOTE — Telephone Encounter (Signed)
Five days is frequently enough, and I don't want her to have diarrhea. Stop the doxy and keep me posted if COD gets worse.

## 2020-04-24 ENCOUNTER — Other Ambulatory Visit: Payer: Self-pay | Admitting: Family Medicine

## 2020-05-04 DIAGNOSIS — I2510 Atherosclerotic heart disease of native coronary artery without angina pectoris: Secondary | ICD-10-CM | POA: Diagnosis not present

## 2020-05-04 DIAGNOSIS — E785 Hyperlipidemia, unspecified: Secondary | ICD-10-CM | POA: Diagnosis not present

## 2020-05-12 ENCOUNTER — Other Ambulatory Visit: Payer: Self-pay | Admitting: Family Medicine

## 2020-05-21 DIAGNOSIS — Z23 Encounter for immunization: Secondary | ICD-10-CM | POA: Diagnosis not present

## 2020-06-22 ENCOUNTER — Other Ambulatory Visit: Payer: Self-pay | Admitting: Family Medicine

## 2020-06-30 ENCOUNTER — Other Ambulatory Visit: Payer: Self-pay | Admitting: Family Medicine

## 2020-08-10 ENCOUNTER — Other Ambulatory Visit: Payer: Self-pay | Admitting: Family Medicine

## 2020-08-19 ENCOUNTER — Other Ambulatory Visit: Payer: Self-pay | Admitting: Family Medicine

## 2020-09-11 ENCOUNTER — Other Ambulatory Visit: Payer: Self-pay | Admitting: Family Medicine

## 2020-09-15 ENCOUNTER — Ambulatory Visit (INDEPENDENT_AMBULATORY_CARE_PROVIDER_SITE_OTHER): Payer: Medicare Other | Admitting: Family Medicine

## 2020-09-15 ENCOUNTER — Encounter: Payer: Self-pay | Admitting: Family Medicine

## 2020-09-15 VITALS — BP 126/62 | HR 60 | Ht 61.5 in | Wt 213.0 lb

## 2020-09-15 DIAGNOSIS — E559 Vitamin D deficiency, unspecified: Secondary | ICD-10-CM | POA: Diagnosis not present

## 2020-09-15 DIAGNOSIS — I10 Essential (primary) hypertension: Secondary | ICD-10-CM | POA: Diagnosis not present

## 2020-09-15 DIAGNOSIS — J449 Chronic obstructive pulmonary disease, unspecified: Secondary | ICD-10-CM

## 2020-09-15 DIAGNOSIS — I420 Dilated cardiomyopathy: Secondary | ICD-10-CM | POA: Diagnosis not present

## 2020-09-15 DIAGNOSIS — E782 Mixed hyperlipidemia: Secondary | ICD-10-CM

## 2020-09-15 MED ORDER — COREG 12.5 MG PO TABS: ORAL_TABLET | ORAL | 3 refills | Status: DC

## 2020-09-15 MED ORDER — FUROSEMIDE 40 MG PO TABS: 80.0000 mg | ORAL_TABLET | Freq: Every day | ORAL | 3 refills | Status: AC

## 2020-09-15 MED ORDER — ALBUTEROL SULFATE HFA 108 (90 BASE) MCG/ACT IN AERS: 2.0000 | INHALATION_SPRAY | RESPIRATORY_TRACT | 11 refills | Status: AC | PRN

## 2020-09-15 MED ORDER — DIPHENHYDRAMINE HCL 25 MG PO TABS: 25.0000 mg | ORAL_TABLET | Freq: Every evening | ORAL | 0 refills | Status: AC | PRN

## 2020-09-15 MED ORDER — POTASSIUM CHLORIDE CRYS ER 20 MEQ PO TBCR: 40.0000 meq | EXTENDED_RELEASE_TABLET | Freq: Every day | ORAL | 3 refills | Status: AC

## 2020-09-15 NOTE — Progress Notes (Signed)
Subjective:  Patient ID: Alexis Powell, female    DOB: [birth year]  Age: 80 y.o. MRN: [id number]  CC: No chief complaint on file.   HPI Alexis Powell presents for recheck of her usual conditions. Dyspnea well controlled. Continues to use Trelegy prescribed by pulmonology.   in for follow-up of elevated cholesterol. Doing well without complaints on current medication. Denies side effects of statin including myalgia and arthralgia and nausea. Currently no chest pain, shortness of breath or other cardiovascular related symptoms noted.   presents for  follow-up of hypertension. Patient has no history of headache chest pain or shortness of breath or recent cough. Patient also denies symptoms of TIA such as focal numbness or weakness. Patient denies side effects from medication. States taking it regularly.   Depression screen [facility] 2/9 12/28/2018 04/16/2018 08/08/2017  Decreased Interest 0 0 0  Down, Depressed, Hopeless 0 0 0  PHQ - 2 Score 0 0 0    History Alexis Powell has a past medical history of Hyperlipidemia, Hypertension, and Myocardial infarction ([place]).   She has a past surgical history that includes Coronary artery bypass graft; Abdominal hysterectomy; Cesarean section; Appendectomy; Cholecystectomy; and Eye surgery (Bilateral).   Her family history includes Heart disease in her father.She reports that she quit smoking about 21 years ago. She has never used smokeless tobacco. She reports that she does not drink alcohol and does not use drugs.    ROS Review of Systems  Constitutional: Negative.   HENT: Negative.   Eyes: Negative for visual disturbance.  Respiratory: Negative for shortness of breath.   Cardiovascular: Negative for chest pain.  Gastrointestinal: Negative for abdominal pain.  Musculoskeletal: Negative for arthralgias.    Objective:  BP 126/62   Pulse 60   Ht 5' 1.5" (1.562 m)   Wt 213 lb (96.6 kg)   SpO2 97%   BMI 39.59 kg/m   BP Readings from Last 3 Encounters:   09/15/20 126/62  04/16/18 134/64  08/08/17 127/63    Wt Readings from Last 3 Encounters:  09/15/20 213 lb (96.6 kg)  12/28/18 219 lb (99.3 kg)  04/16/18 219 lb (99.3 kg)     Physical Exam  Exam deferred. Pt. Harboring due to COVID 19. Phone visit performed.   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[place]) -     CBC with Differential/Platelet; Future -     CMP14+EGFR; Future  COPD mixed type (HCC) -     CBC with Differential/Platelet; Future -     CMP14+EGFR; Future  Essential hypertension -     CBC with Differential/Platelet; Future -     CMP14+EGFR; Future  Mixed hyperlipidemia -     CBC with Differential/Platelet; Future -     CMP14+EGFR; Future -     Lipid panel; Future  Vitamin D deficiency -     VITAMIN D 25 Hydroxy (Vit-D Deficiency, Fractures); Future  Other orders -     COREG 12.5 MG tablet; TAKE 1/2 TABLET BY MOUTH EVERY MORNING AND 1 TABLET EVERY EVENING -     diphenhydrAMINE (BENADRYL ALLERGY) 25 MG tablet; Take 1 tablet (25 mg total) by mouth at bedtime as needed for allergies. -     albuterol (PROAIR HFA) 108 (90 Base) MCG/ACT inhaler; Inhale 2 puffs into the lungs every 4 (four) hours as needed for wheezing or shortness of breath. -     furosemide (LASIX) 40 MG tablet; Take 2 tablets (80 mg total) by  mouth daily. -     potassium chloride SA (KLOR-CON) 20 MEQ tablet; Take 2 tablets (40 mEq total) by mouth daily.       I have discontinued Alexis Powell's doxycycline. I have changed her carvedilol to Coreg. I have also changed her diphenhydrAMINE, furosemide, and potassium chloride SA. Additionally, I am having her start on albuterol. Lastly, I am having her maintain her aspirin, Trelegy Ellipta, acetaminophen-codeine, famotidine, isosorbide mononitrate, Altace, and Coreg.  Allergies as of 09/15/2020      Reactions   Morphine And Related Anaphylaxis   Penicillins Anaphylaxis   Phenol Anaphylaxis    Sulfa Antibiotics Rash   Iodine-131 Rash   IVP dye      Medication List       Accurate as of September 15, 2020 10:35 PM. If you have any questions, ask your nurse or doctor.        STOP taking these medications   carvedilol 12.5 MG tablet Commonly known as: Coreg Replaced by: Coreg 12.5 MG tablet You also have another medication with the same name that you need to continue taking as instructed. Stopped by: Claretta Fraise, MD   doxycycline 100 MG tablet Commonly known as: VIBRA-TABS Stopped by: Claretta Fraise, MD     TAKE these medications   acetaminophen-codeine 300-30 MG tablet Commonly known as: TYLENOL #3 Take 1 tablet by mouth every 6 (six) hours as needed for moderate pain.   albuterol 108 (90 Base) MCG/ACT inhaler Commonly known as: ProAir HFA Inhale 2 puffs into the lungs every 4 (four) hours as needed for wheezing or shortness of breath. Started by: Claretta Fraise, MD   Altace 10 MG capsule Generic drug: ramipril TAKE 1 CAPSULE(10 MG) BY MOUTH DAILY   aspirin 81 MG chewable tablet Chew 81 mg by mouth daily.   Coreg 6.25 MG tablet Generic drug: carvedilol TAKE 1 TABLET BY MOUTH EVERY MORNING AND TAKE 2 TABLETS EVERY EVENING What changed:   Another medication with the same name was added. Make sure you understand how and when to take each.  Another medication with the same name was removed. Continue taking this medication, and follow the directions you see here. Changed by: Claretta Fraise, MD   Coreg 12.5 MG tablet Generic drug: carvedilol TAKE 1/2 TABLET BY MOUTH EVERY MORNING AND 1 TABLET EVERY EVENING What changed: You were already taking a medication with the same name, and this prescription was added. Make sure you understand how and when to take each. Replaces: carvedilol 12.5 MG tablet Changed by: Claretta Fraise, MD   diphenhydrAMINE 25 MG tablet Commonly known as: Benadryl Allergy Take 1 tablet (25 mg total) by mouth at bedtime as needed for  allergies. What changed:   when to take this  reasons to take this Changed by: Claretta Fraise, MD   famotidine 20 MG tablet Commonly known as: PEPCID TAKE 1 TABLET(20 MG) BY MOUTH TWICE DAILY   furosemide 40 MG tablet Commonly known as: LASIX Take 2 tablets (80 mg total) by mouth daily. What changed: additional instructions Changed by: Claretta Fraise, MD   isosorbide mononitrate 30 MG 24 hr tablet Commonly known as: IMDUR Take 1 tablet (30 mg total) by mouth daily. (Needs to be seen before next refill)   potassium chloride SA 20 MEQ tablet Commonly known as: KLOR-CON Take 2 tablets (40 mEq total) by mouth daily. What changed: additional instructions Changed by: Claretta Fraise, MD   Trelegy Cleaster [place] 100-62.5-25 MCG/INH Aepb Generic drug: Fluticasone-Umeclidin-Vilant     Virtual  Visit via telephone Note  I discussed the limitations, risks, security and privacy concerns of performing an evaluation and management service by telephone and the availability of in person appointments. I also discussed with the patient that there may be a patient responsible charge related to this service. The patient expressed understanding and agreed to proceed. Pt. Is at home. Dr. Livia Snellen is in his office.  Follow Up Instructions:   I discussed the assessment and treatment plan with the patient. The patient was provided an opportunity to ask questions and all were answered. The patient agreed with the plan and demonstrated an understanding of the instructions.   The patient was advised to call back or seek an in-person evaluation if the symptoms worsen or if the condition fails to improve as anticipated.  Total minutes including chart review and phone contact time: 26    Follow-up: Return in about 6 months (around 03/18/2021).  Claretta Fraise, M.D.

## 2020-09-28 ENCOUNTER — Ambulatory Visit (INDEPENDENT_AMBULATORY_CARE_PROVIDER_SITE_OTHER): Payer: Medicare Other

## 2020-09-28 DIAGNOSIS — Z0000 Encounter for general adult medical examination without abnormal findings: Secondary | ICD-10-CM | POA: Diagnosis not present

## 2020-09-28 NOTE — Progress Notes (Signed)
MEDICARE ANNUAL WELLNESS VISIT  09/28/2020  Telephone Visit Disclaimer This Medicare AWV was conducted by telephone due to national recommendations for restrictions regarding Alexis COVID-19 Pandemic (e.g. social distancing).  I verified, using two identifiers, that I am speaking with Alexis Powell or their authorized healthcare agent. I discussed Alexis limitations, risks, security, and privacy concerns of performing an evaluation and management service by telephone and Alexis potential availability of an in-person appointment in Alexis future. Alexis patient expressed understanding and agreed to proceed.  Location of Patient: Home Location of Provider (nurse):  [facility]  Subjective:    Alexis Powell is a 80 y.o. female patient of Stacks, Cletus Gash, MD who had a Medicare Annual Wellness Visit today via telephone. Alexis Powell lives in [place] with her daughter. She had 4 children. 2 sons have passed away and she has another daughter that lives just down Alexis road from her now. She is retired and worked as an Corporate treasurer. She is on oxygen 24 hours a day so it is difficult for her to do any type of exercise or go out much. She enjoys working in her flowers.   Patient Care Team: Claretta Fraise, MD as PCP - General (Family Medicine)  Advanced Directives 09/28/2020 12/28/2018  Does Patient Have a Medical Advance Directive? No No  Would patient like information on creating a medical advance directive? No - Patient declined Yes (MAU/Ambulatory/Procedural Areas - Information given)    Hospital Utilization Over Alexis Past 12 Months: # of hospitalizations or ER visits: 0 # of surgeries: 0  Review of Systems    Patient reports that her overall health is unchanged compared to last year.  History obtained from chart review  Patient Reported Readings (BP, Pulse, CBG, Weight, etc) none  Pain Assessment Pain : No/denies pain     Current Medications & Allergies (verified) Allergies as of  09/28/2020      Reactions   Morphine And Related Anaphylaxis   Penicillins Anaphylaxis   Phenol Anaphylaxis   Sulfa Antibiotics Rash   Iodine-131 Rash   IVP dye      Medication List       Accurate as of September 28, 2020 10:55 AM. If you have any questions, ask your nurse or doctor.        acetaminophen-codeine 300-30 MG tablet Commonly known as: TYLENOL #3 Take 1 tablet by mouth every 6 (six) hours as needed for moderate pain.   albuterol 108 (90 Base) MCG/ACT inhaler Commonly known as: ProAir HFA Inhale 2 puffs into Alexis lungs every 4 (four) hours as needed for wheezing or shortness of breath.   Altace 10 MG capsule Generic drug: ramipril TAKE 1 CAPSULE(10 MG) BY MOUTH DAILY   aspirin 81 MG chewable tablet Chew 81 mg by mouth daily.   Coreg 6.25 MG tablet Generic drug: carvedilol TAKE 1 TABLET BY MOUTH EVERY MORNING AND TAKE 2 TABLETS EVERY EVENING   Coreg 12.5 MG tablet Generic drug: carvedilol TAKE 1/2 TABLET BY MOUTH EVERY MORNING AND 1 TABLET EVERY EVENING   diphenhydrAMINE 25 MG tablet Commonly known as: Benadryl Allergy Take 1 tablet (25 mg total) by mouth at bedtime as needed for allergies.   famotidine 20 MG tablet Commonly known as: PEPCID TAKE 1 TABLET(20 MG) BY MOUTH TWICE DAILY   furosemide 40 MG tablet Commonly known as: LASIX Take 2 tablets (80 mg total) by mouth daily.   isosorbide mononitrate 30 MG 24 hr tablet Commonly known as: IMDUR Take 1 tablet (  30 mg total) by mouth daily. (Needs to be seen before next refill)   potassium chloride SA 20 MEQ tablet Commonly known as: KLOR-CON Take 2 tablets (40 mEq total) by mouth daily.   Trelegy Ellipta 100-62.5-25 MCG/INH Aepb Generic drug: Fluticasone-Umeclidin-Vilant       History (reviewed): Past Medical History:  Diagnosis Date  . CHF (congestive heart failure) ([place])   . COPD (chronic obstructive pulmonary disease) ([place])   . Hyperlipidemia   . Hypertension   . Myocardial infarction  [facility])    Past Surgical History:  Procedure Laterality Date  . ABDOMINAL HYSTERECTOMY    . APPENDECTOMY    . CESAREAN SECTION    . CHOLECYSTECTOMY    . CORONARY ARTERY BYPASS GRAFT    . EYE SURGERY Bilateral    cataracts   Family History  Problem Relation Age of Onset  . Heart disease Mother   . Heart disease Father    Social History   Socioeconomic History  . Marital status: Divorced    Spouse name: Not on file  . Number of children: 4  . Years of education: Not on file  . Highest education level: Associate degree: occupational, Hotel manager, or vocational program  Occupational History  . Occupation: retired  Tobacco Use  . Smoking status: Former Smoker    Quit date: 03/20/1999    Years since quitting: 21.5  . Smokeless tobacco: Never Used  Substance and Sexual Activity  . Alcohol use: No    Alcohol/week: 0.0 standard drinks  . Drug use: No  . Sexual activity: Not Currently  Other Topics Concern  . Not on file  Social History Narrative  . Not on file   Social Determinants of Health   Financial Resource Strain: Not on file  Food Insecurity: Not on file  Transportation Needs: Not on file  Physical Activity: Not on file  Stress: Not on file  Social Connections: Not on file    Activities of Daily Living In your present state of health, do you have any difficulty performing Alexis following activities: 09/28/2020  Hearing? N  Vision? N  Difficulty concentrating or making decisions? N  Walking or climbing stairs? N  Dressing or bathing? N  Doing errands, shopping? N  Preparing Food and eating ? N  Using Alexis Toilet? N  In Alexis past six months, have you accidently leaked urine? N  Do you have problems with loss of bowel control? N  Managing your Medications? N  Managing your Finances? N  Housekeeping or managing your Housekeeping? N  Some recent data might be hidden    Patient Education/ Literacy How often do you need to have someone help you when you read  instructions, pamphlets, or other written materials from your doctor or pharmacy?: 1 - Never What is Alexis last grade level you completed in school?: 12th grade  Exercise Current Exercise Habits: Alexis patient does not participate in regular exercise at present, Exercise limited by: respiratory conditions(s)  Diet Patient reports consuming 3 meals a day and 1 snack(s) a day Patient reports that her primary diet is: Regular Patient reports that she does have regular access to food.   Depression Screen PHQ 2/9 Scores 09/28/2020 12/28/2018 04/16/2018 08/08/2017 12/05/2016 09/02/2016 08/09/2016  PHQ - 2 Score 0 0 0 0 0 0 0     Fall Risk Fall Risk  09/28/2020 12/28/2018 04/16/2018 08/08/2017 12/05/2016  Falls in Alexis past year? 0 0 No No No     Objective:  Alexis Powell seemed  alert and oriented and she participated appropriately during our telephone visit.  Blood Pressure Weight BMI  BP Readings from Last 3 Encounters:  09/15/20 126/62  04/16/18 134/64  08/08/17 127/63   Wt Readings from Last 3 Encounters:  09/15/20 213 lb (96.6 kg)  12/28/18 219 lb (99.3 kg)  04/16/18 219 lb (99.3 kg)   BMI Readings from Last 1 Encounters:  09/15/20 39.59 kg/m    *Unable to obtain current vital signs, weight, and BMI due to telephone visit type  Hearing/Vision  . Alexis Powell did not seem to have difficulty with hearing/understanding during Alexis telephone conversation . Reports that she has had a formal eye exam by an eye care professional within Alexis past year . Reports that she has had a formal hearing evaluation within Alexis past year *Unable to fully assess hearing and vision during telephone visit type  Cognitive Function: 6CIT Screen 09/28/2020 12/28/2018  What Year? 0 points 0 points  What month? 0 points 0 points  What time? 0 points 0 points  Count back from 20 0 points 0 points  Months in reverse 0 points 0 points  Repeat phrase 0 points 0 points  Total Score 0 0   (Normal:0-7, Significant for  Dysfunction: >8)  Normal Cognitive Function Screening: Yes   Immunization & Health Maintenance Record Immunization History  Administered Date(s) Administered  . PFIZER(Purple Top)SARS-COV-2 Vaccination 08/27/2019, 09/19/2019, 04/03/2020  . Pneumococcal Conjugate-13 05/04/2013    Health Maintenance  Topic Date Due  . TETANUS/TDAP  Never done  . PNA vac Low Risk Adult (2 of 2 - PPSV23) 05/04/2014  . INFLUENZA VACCINE  10/01/2020 (Originally 02/02/2020)  . DEXA SCAN  Completed  . COVID-19 Vaccine  Completed  . HPV VACCINES  Aged Out       Assessment  This is a routine wellness examination for Alexis Powell.  Health Maintenance: Due or Overdue Health Maintenance Due  Topic Date Due  . TETANUS/TDAP  Never done  . PNA vac Low Risk Adult (2 of 2 - PPSV23) 05/04/2014    Alexis Powell does not need a referral for Community Assistance: Care Management:   no Social Work:    no Prescription Assistance:  no Nutrition/Diabetes Education:  no   Plan:  Personalized Goals Goals Addressed            This Visit's Progress   . DIET - EAT MORE FRUITS AND VEGETABLES        Personalized Health Maintenance & Screening Recommendations  Needs another pneumonia vaccine   Lung Cancer Screening Recommended: no (Low Dose CT Chest recommended if Age 75-80 years, 30 pack-year currently smoking OR have quit w/in past 15 years) Hepatitis C Screening recommended: done HIV Screening recommended: no  Advanced Directives: Written information was not prepared per patient's request.  Referrals & Orders No orders of Alexis defined types were placed in this encounter.   Follow-up Plan . Follow-up with Claretta Fraise, MD as planned . Check on pneumonia vaccine at next office visit    I have personally reviewed and noted Alexis following in Alexis patient's chart:   . Medical and social history . Use of alcohol, tobacco or illicit drugs  . Current medications and supplements . Functional ability  and status . Nutritional status . Physical activity . Advanced directives . List of other physicians . Hospitalizations, surgeries, and ER visits in previous 12 months . Vitals . Screenings to include cognitive, depression, and falls . Referrals and appointments  In addition, I have reviewed  and discussed with Alexis Powell certain preventive protocols, quality metrics, and best practice recommendations. A written personalized care plan for preventive services as well as general preventive health recommendations is available and can be mailed to Alexis patient at her request.      Rolena Infante LPN 0/06/2240

## 2020-09-28 NOTE — Patient Instructions (Signed)
  Ms. Stege , Thank you for taking time to come for your Medicare Wellness Visit. I appreciate your ongoing commitment to your health goals. Please review the following plan we discussed and let me know if I can assist you in the future.   These are the goals we discussed: Goals    . DIET - EAT MORE FRUITS AND VEGETABLES    . Exercise 3x per week (30 min per time)     Try to exercise for at least 30 minutes, 3 times weekly       This is a list of the screening recommended for you and due dates:  Health Maintenance  Topic Date Due  . Tetanus Vaccine  Never done  . Pneumonia vaccines (2 of 2 - PPSV23) 05/04/2014  . Flu Shot  10/01/2020*  . DEXA scan (bone density measurement)  Completed  . COVID-19 Vaccine  Completed  . HPV Vaccine  Aged Out  *Topic was postponed. The date shown is not the original due date.

## 2020-10-27 DIAGNOSIS — E785 Hyperlipidemia, unspecified: Secondary | ICD-10-CM | POA: Diagnosis not present

## 2020-10-27 DIAGNOSIS — I350 Nonrheumatic aortic (valve) stenosis: Secondary | ICD-10-CM | POA: Diagnosis not present

## 2020-10-27 DIAGNOSIS — Z951 Presence of aortocoronary bypass graft: Secondary | ICD-10-CM | POA: Diagnosis not present

## 2020-11-12 DIAGNOSIS — Z20822 Contact with and (suspected) exposure to covid-19: Secondary | ICD-10-CM | POA: Diagnosis not present

## 2020-11-12 DIAGNOSIS — J440 Chronic obstructive pulmonary disease with acute lower respiratory infection: Secondary | ICD-10-CM | POA: Diagnosis not present

## 2020-11-12 DIAGNOSIS — J209 Acute bronchitis, unspecified: Secondary | ICD-10-CM | POA: Diagnosis not present

## 2020-11-12 DIAGNOSIS — I10 Essential (primary) hypertension: Secondary | ICD-10-CM | POA: Diagnosis not present

## 2020-11-12 DIAGNOSIS — E669 Obesity, unspecified: Secondary | ICD-10-CM | POA: Diagnosis not present

## 2020-11-12 DIAGNOSIS — I509 Heart failure, unspecified: Secondary | ICD-10-CM | POA: Diagnosis not present

## 2020-11-23 ENCOUNTER — Other Ambulatory Visit: Payer: Self-pay | Admitting: Family Medicine

## 2020-12-02 DIAGNOSIS — J449 Chronic obstructive pulmonary disease, unspecified: Secondary | ICD-10-CM | POA: Diagnosis not present

## 2020-12-02 DIAGNOSIS — R0902 Hypoxemia: Secondary | ICD-10-CM | POA: Diagnosis not present

## 2020-12-02 DIAGNOSIS — I428 Other cardiomyopathies: Secondary | ICD-10-CM | POA: Diagnosis not present

## 2021-02-11 DIAGNOSIS — I350 Nonrheumatic aortic (valve) stenosis: Secondary | ICD-10-CM | POA: Diagnosis not present

## 2021-02-11 DIAGNOSIS — E785 Hyperlipidemia, unspecified: Secondary | ICD-10-CM | POA: Diagnosis not present

## 2021-02-11 DIAGNOSIS — Z951 Presence of aortocoronary bypass graft: Secondary | ICD-10-CM | POA: Diagnosis not present

## 2021-02-11 DIAGNOSIS — I5033 Acute on chronic diastolic (congestive) heart failure: Secondary | ICD-10-CM | POA: Diagnosis not present

## 2021-02-11 DIAGNOSIS — I428 Other cardiomyopathies: Secondary | ICD-10-CM | POA: Diagnosis not present

## 2021-02-18 DIAGNOSIS — J1282 Pneumonia due to coronavirus disease 2019: Secondary | ICD-10-CM | POA: Diagnosis not present

## 2021-02-18 DIAGNOSIS — R0902 Hypoxemia: Secondary | ICD-10-CM | POA: Diagnosis not present

## 2021-02-18 DIAGNOSIS — I428 Other cardiomyopathies: Secondary | ICD-10-CM | POA: Diagnosis not present

## 2021-02-18 DIAGNOSIS — U071 COVID-19: Secondary | ICD-10-CM | POA: Diagnosis not present

## 2021-02-18 DIAGNOSIS — J449 Chronic obstructive pulmonary disease, unspecified: Secondary | ICD-10-CM | POA: Diagnosis not present

## 2021-05-03 DIAGNOSIS — Z9981 Dependence on supplemental oxygen: Secondary | ICD-10-CM | POA: Diagnosis not present

## 2021-05-03 DIAGNOSIS — Z9989 Dependence on other enabling machines and devices: Secondary | ICD-10-CM | POA: Diagnosis not present

## 2021-05-03 DIAGNOSIS — J449 Chronic obstructive pulmonary disease, unspecified: Secondary | ICD-10-CM | POA: Diagnosis not present

## 2021-05-03 DIAGNOSIS — R0902 Hypoxemia: Secondary | ICD-10-CM | POA: Diagnosis not present

## 2021-05-03 DIAGNOSIS — I428 Other cardiomyopathies: Secondary | ICD-10-CM | POA: Diagnosis not present

## 2021-05-03 DIAGNOSIS — U071 COVID-19: Secondary | ICD-10-CM | POA: Diagnosis not present

## 2021-05-11 DIAGNOSIS — I21A9 Other myocardial infarction type: Secondary | ICD-10-CM | POA: Diagnosis not present

## 2021-05-11 DIAGNOSIS — I350 Nonrheumatic aortic (valve) stenosis: Secondary | ICD-10-CM | POA: Diagnosis not present

## 2021-05-11 DIAGNOSIS — Z951 Presence of aortocoronary bypass graft: Secondary | ICD-10-CM | POA: Diagnosis not present

## 2021-05-11 DIAGNOSIS — I5022 Chronic systolic (congestive) heart failure: Secondary | ICD-10-CM | POA: Diagnosis not present

## 2021-05-11 DIAGNOSIS — I428 Other cardiomyopathies: Secondary | ICD-10-CM | POA: Diagnosis not present

## 2021-05-13 ENCOUNTER — Telehealth: Payer: Self-pay | Admitting: Family Medicine

## 2021-05-13 NOTE — Telephone Encounter (Signed)
Patient states she takes 80mg  of Potassium, should she cut back on that?

## 2021-05-13 NOTE — Telephone Encounter (Signed)
The potassium is mildly elevated. Not enough for danger. Everyhing else looks good.

## 2021-05-17 NOTE — Telephone Encounter (Signed)
Patient aware.

## 2021-05-17 NOTE — Telephone Encounter (Signed)
Yes. Decrease to 60 meq daily

## 2021-05-25 ENCOUNTER — Encounter: Payer: Self-pay | Admitting: Family Medicine

## 2021-05-25 ENCOUNTER — Other Ambulatory Visit: Payer: Self-pay | Admitting: Family Medicine

## 2021-05-25 NOTE — Telephone Encounter (Signed)
Called to schedule appt, no answer. Letter sent.  

## 2021-05-25 NOTE — Telephone Encounter (Signed)
Last OV 09/2020. Last RF 05/2020 (1 year supply). Next OV not scheduled  30 day supply given today-needs appt for further refills

## 2021-05-31 DIAGNOSIS — I517 Cardiomegaly: Secondary | ICD-10-CM | POA: Diagnosis not present

## 2021-05-31 DIAGNOSIS — I083 Combined rheumatic disorders of mitral, aortic and tricuspid valves: Secondary | ICD-10-CM | POA: Diagnosis not present

## 2021-05-31 DIAGNOSIS — I5189 Other ill-defined heart diseases: Secondary | ICD-10-CM | POA: Diagnosis not present

## 2021-06-03 DIAGNOSIS — R9439 Abnormal result of other cardiovascular function study: Secondary | ICD-10-CM | POA: Insufficient documentation

## 2021-06-14 ENCOUNTER — Other Ambulatory Visit: Payer: Self-pay

## 2021-06-14 ENCOUNTER — Telehealth: Payer: Self-pay | Admitting: Family Medicine

## 2021-06-14 DIAGNOSIS — E875 Hyperkalemia: Secondary | ICD-10-CM

## 2021-06-14 NOTE — Telephone Encounter (Signed)
Pt states that her potassium level was 2 weeks ago and was elevated and was told to have it re checked. She will be going to a [facility] in [place] to have it drawn. Future BMP order placed.

## 2021-06-18 DIAGNOSIS — M791 Myalgia, unspecified site: Secondary | ICD-10-CM | POA: Diagnosis not present

## 2021-06-18 DIAGNOSIS — J208 Acute bronchitis due to other specified organisms: Secondary | ICD-10-CM | POA: Diagnosis not present

## 2021-06-18 DIAGNOSIS — B9689 Other specified bacterial agents as the cause of diseases classified elsewhere: Secondary | ICD-10-CM | POA: Diagnosis not present

## 2021-06-18 DIAGNOSIS — Z20822 Contact with and (suspected) exposure to covid-19: Secondary | ICD-10-CM | POA: Diagnosis not present

## 2021-06-24 ENCOUNTER — Other Ambulatory Visit: Payer: Self-pay | Admitting: Family Medicine

## 2021-06-24 DIAGNOSIS — Z20822 Contact with and (suspected) exposure to covid-19: Secondary | ICD-10-CM | POA: Diagnosis present

## 2021-06-24 DIAGNOSIS — I359 Nonrheumatic aortic valve disorder, unspecified: Secondary | ICD-10-CM | POA: Diagnosis not present

## 2021-06-24 DIAGNOSIS — R0602 Shortness of breath: Secondary | ICD-10-CM | POA: Diagnosis not present

## 2021-06-24 DIAGNOSIS — I428 Other cardiomyopathies: Secondary | ICD-10-CM | POA: Diagnosis present

## 2021-06-24 DIAGNOSIS — Z91041 Radiographic dye allergy status: Secondary | ICD-10-CM | POA: Diagnosis not present

## 2021-06-24 DIAGNOSIS — J449 Chronic obstructive pulmonary disease, unspecified: Secondary | ICD-10-CM | POA: Diagnosis present

## 2021-06-24 DIAGNOSIS — E785 Hyperlipidemia, unspecified: Secondary | ICD-10-CM | POA: Diagnosis present

## 2021-06-24 DIAGNOSIS — I350 Nonrheumatic aortic (valve) stenosis: Secondary | ICD-10-CM | POA: Diagnosis not present

## 2021-06-24 DIAGNOSIS — I252 Old myocardial infarction: Secondary | ICD-10-CM | POA: Diagnosis not present

## 2021-06-24 DIAGNOSIS — R0789 Other chest pain: Secondary | ICD-10-CM | POA: Diagnosis not present

## 2021-06-24 DIAGNOSIS — I959 Hypotension, unspecified: Secondary | ICD-10-CM | POA: Diagnosis not present

## 2021-06-24 DIAGNOSIS — I700 Atherosclerosis of aorta: Secondary | ICD-10-CM | POA: Diagnosis not present

## 2021-06-24 DIAGNOSIS — Z743 Need for continuous supervision: Secondary | ICD-10-CM | POA: Diagnosis not present

## 2021-06-24 DIAGNOSIS — I2510 Atherosclerotic heart disease of native coronary artery without angina pectoris: Secondary | ICD-10-CM | POA: Diagnosis not present

## 2021-06-24 DIAGNOSIS — R0902 Hypoxemia: Secondary | ICD-10-CM | POA: Diagnosis not present

## 2021-06-24 DIAGNOSIS — I080 Rheumatic disorders of both mitral and aortic valves: Secondary | ICD-10-CM | POA: Diagnosis not present

## 2021-06-24 DIAGNOSIS — I4510 Unspecified right bundle-branch block: Secondary | ICD-10-CM | POA: Diagnosis present

## 2021-06-24 DIAGNOSIS — Z880 Allergy status to penicillin: Secondary | ICD-10-CM | POA: Diagnosis not present

## 2021-06-24 DIAGNOSIS — Z6839 Body mass index (BMI) 39.0-39.9, adult: Secondary | ICD-10-CM | POA: Diagnosis not present

## 2021-06-24 DIAGNOSIS — I130 Hypertensive heart and chronic kidney disease with heart failure and stage 1 through stage 4 chronic kidney disease, or unspecified chronic kidney disease: Secondary | ICD-10-CM | POA: Diagnosis present

## 2021-06-24 DIAGNOSIS — I213 ST elevation (STEMI) myocardial infarction of unspecified site: Secondary | ICD-10-CM | POA: Diagnosis not present

## 2021-06-24 DIAGNOSIS — Z888 Allergy status to other drugs, medicaments and biological substances status: Secondary | ICD-10-CM | POA: Diagnosis not present

## 2021-06-24 DIAGNOSIS — R079 Chest pain, unspecified: Secondary | ICD-10-CM | POA: Diagnosis not present

## 2021-06-24 DIAGNOSIS — I5189 Other ill-defined heart diseases: Secondary | ICD-10-CM | POA: Diagnosis not present

## 2021-06-24 DIAGNOSIS — R778 Other specified abnormalities of plasma proteins: Secondary | ICD-10-CM | POA: Diagnosis not present

## 2021-06-24 DIAGNOSIS — I253 Aneurysm of heart: Secondary | ICD-10-CM | POA: Diagnosis not present

## 2021-06-24 DIAGNOSIS — J9611 Chronic respiratory failure with hypoxia: Secondary | ICD-10-CM | POA: Diagnosis present

## 2021-06-24 DIAGNOSIS — D649 Anemia, unspecified: Secondary | ICD-10-CM | POA: Diagnosis present

## 2021-06-24 DIAGNOSIS — Z87891 Personal history of nicotine dependence: Secondary | ICD-10-CM | POA: Diagnosis not present

## 2021-06-24 DIAGNOSIS — Z885 Allergy status to narcotic agent status: Secondary | ICD-10-CM | POA: Diagnosis not present

## 2021-06-24 DIAGNOSIS — Z951 Presence of aortocoronary bypass graft: Secondary | ICD-10-CM | POA: Diagnosis not present

## 2021-06-24 DIAGNOSIS — R918 Other nonspecific abnormal finding of lung field: Secondary | ICD-10-CM | POA: Diagnosis not present

## 2021-06-24 DIAGNOSIS — Z9861 Coronary angioplasty status: Secondary | ICD-10-CM | POA: Diagnosis not present

## 2021-06-24 DIAGNOSIS — E669 Obesity, unspecified: Secondary | ICD-10-CM | POA: Diagnosis present

## 2021-06-24 DIAGNOSIS — Z882 Allergy status to sulfonamides status: Secondary | ICD-10-CM | POA: Diagnosis not present

## 2021-06-24 DIAGNOSIS — Z9981 Dependence on supplemental oxygen: Secondary | ICD-10-CM | POA: Diagnosis not present

## 2021-06-24 DIAGNOSIS — I25119 Atherosclerotic heart disease of native coronary artery with unspecified angina pectoris: Secondary | ICD-10-CM | POA: Diagnosis present

## 2021-06-24 DIAGNOSIS — I5020 Unspecified systolic (congestive) heart failure: Secondary | ICD-10-CM | POA: Diagnosis present

## 2021-06-24 MED ORDER — ALTACE 10 MG PO CAPS: 10.0000 mg | ORAL_CAPSULE | Freq: Every day | ORAL | 0 refills | Status: DC

## 2021-06-24 NOTE — Telephone Encounter (Signed)
PT says that she has other apts coming up the first of Jan. I offered an apt next week. Pt would not schedule an apt because she lives in [place] and asked for Dr Livia Snellen to give her a call. She thought she had a scheduled apt in March but it is for an awv. I explained that it is not the same as an ov to see DR Stacks. She says that this is not the time for her to be out of her medications. Please call back.

## 2021-06-24 NOTE — Addendum Note (Signed)
Addended by: Antonietta Barcelona D on: 06/24/2021 04:40 PM   Modules accepted: Orders

## 2021-06-24 NOTE — Telephone Encounter (Signed)
Stacks. NTBS 30 days given 05/25/21

## 2021-06-24 NOTE — Telephone Encounter (Signed)
LMOVM refill sent for 30 days to please call the office to make an appt in Jan w/ Dr. Livia Snellen

## 2021-06-25 DIAGNOSIS — Z7982 Long term (current) use of aspirin: Secondary | ICD-10-CM | POA: Diagnosis not present

## 2021-06-25 DIAGNOSIS — E785 Hyperlipidemia, unspecified: Secondary | ICD-10-CM | POA: Diagnosis present

## 2021-06-25 DIAGNOSIS — Z880 Allergy status to penicillin: Secondary | ICD-10-CM | POA: Diagnosis not present

## 2021-06-25 DIAGNOSIS — Z885 Allergy status to narcotic agent status: Secondary | ICD-10-CM | POA: Diagnosis not present

## 2021-06-25 DIAGNOSIS — Z882 Allergy status to sulfonamides status: Secondary | ICD-10-CM | POA: Diagnosis not present

## 2021-06-25 DIAGNOSIS — I959 Hypotension, unspecified: Secondary | ICD-10-CM | POA: Diagnosis not present

## 2021-06-25 DIAGNOSIS — Z888 Allergy status to other drugs, medicaments and biological substances status: Secondary | ICD-10-CM | POA: Diagnosis not present

## 2021-06-25 DIAGNOSIS — I25119 Atherosclerotic heart disease of native coronary artery with unspecified angina pectoris: Secondary | ICD-10-CM | POA: Diagnosis present

## 2021-06-25 DIAGNOSIS — D649 Anemia, unspecified: Secondary | ICD-10-CM | POA: Diagnosis present

## 2021-06-25 DIAGNOSIS — E669 Obesity, unspecified: Secondary | ICD-10-CM | POA: Diagnosis present

## 2021-06-25 DIAGNOSIS — J9611 Chronic respiratory failure with hypoxia: Secondary | ICD-10-CM | POA: Diagnosis present

## 2021-06-25 DIAGNOSIS — Z87891 Personal history of nicotine dependence: Secondary | ICD-10-CM | POA: Diagnosis not present

## 2021-06-25 DIAGNOSIS — Z91041 Radiographic dye allergy status: Secondary | ICD-10-CM | POA: Diagnosis not present

## 2021-06-25 DIAGNOSIS — I700 Atherosclerosis of aorta: Secondary | ICD-10-CM | POA: Diagnosis not present

## 2021-06-25 DIAGNOSIS — Z6839 Body mass index (BMI) 39.0-39.9, adult: Secondary | ICD-10-CM | POA: Diagnosis not present

## 2021-06-25 DIAGNOSIS — I4510 Unspecified right bundle-branch block: Secondary | ICD-10-CM | POA: Diagnosis present

## 2021-06-25 DIAGNOSIS — R778 Other specified abnormalities of plasma proteins: Secondary | ICD-10-CM | POA: Diagnosis not present

## 2021-06-25 DIAGNOSIS — J449 Chronic obstructive pulmonary disease, unspecified: Secondary | ICD-10-CM | POA: Diagnosis present

## 2021-06-25 DIAGNOSIS — I5020 Unspecified systolic (congestive) heart failure: Secondary | ICD-10-CM | POA: Diagnosis present

## 2021-06-25 DIAGNOSIS — I5189 Other ill-defined heart diseases: Secondary | ICD-10-CM | POA: Diagnosis not present

## 2021-06-25 DIAGNOSIS — I428 Other cardiomyopathies: Secondary | ICD-10-CM | POA: Diagnosis present

## 2021-06-25 DIAGNOSIS — I080 Rheumatic disorders of both mitral and aortic valves: Secondary | ICD-10-CM | POA: Diagnosis not present

## 2021-06-25 DIAGNOSIS — I359 Nonrheumatic aortic valve disorder, unspecified: Secondary | ICD-10-CM | POA: Diagnosis not present

## 2021-06-25 DIAGNOSIS — I350 Nonrheumatic aortic (valve) stenosis: Secondary | ICD-10-CM | POA: Diagnosis present

## 2021-06-25 DIAGNOSIS — I253 Aneurysm of heart: Secondary | ICD-10-CM | POA: Diagnosis not present

## 2021-06-25 DIAGNOSIS — Z20822 Contact with and (suspected) exposure to covid-19: Secondary | ICD-10-CM | POA: Diagnosis present

## 2021-06-25 DIAGNOSIS — Z7951 Long term (current) use of inhaled steroids: Secondary | ICD-10-CM | POA: Diagnosis not present

## 2021-06-25 DIAGNOSIS — Z9981 Dependence on supplemental oxygen: Secondary | ICD-10-CM | POA: Diagnosis not present

## 2021-06-25 DIAGNOSIS — Z9861 Coronary angioplasty status: Secondary | ICD-10-CM | POA: Diagnosis not present

## 2021-06-25 DIAGNOSIS — I130 Hypertensive heart and chronic kidney disease with heart failure and stage 1 through stage 4 chronic kidney disease, or unspecified chronic kidney disease: Secondary | ICD-10-CM | POA: Diagnosis present

## 2021-06-25 DIAGNOSIS — I252 Old myocardial infarction: Secondary | ICD-10-CM | POA: Diagnosis not present

## 2021-06-25 DIAGNOSIS — R0789 Other chest pain: Secondary | ICD-10-CM | POA: Diagnosis not present

## 2021-06-25 DIAGNOSIS — Z951 Presence of aortocoronary bypass graft: Secondary | ICD-10-CM | POA: Diagnosis not present

## 2021-06-25 DIAGNOSIS — R079 Chest pain, unspecified: Secondary | ICD-10-CM | POA: Diagnosis not present

## 2021-06-25 DIAGNOSIS — I2510 Atherosclerotic heart disease of native coronary artery without angina pectoris: Secondary | ICD-10-CM | POA: Diagnosis not present

## 2021-07-05 DIAGNOSIS — R0902 Hypoxemia: Secondary | ICD-10-CM | POA: Diagnosis not present

## 2021-07-05 DIAGNOSIS — J449 Chronic obstructive pulmonary disease, unspecified: Secondary | ICD-10-CM | POA: Diagnosis not present

## 2021-07-05 DIAGNOSIS — I428 Other cardiomyopathies: Secondary | ICD-10-CM | POA: Diagnosis not present

## 2021-07-05 DIAGNOSIS — I350 Nonrheumatic aortic (valve) stenosis: Secondary | ICD-10-CM | POA: Diagnosis not present

## 2021-07-05 DIAGNOSIS — Z9981 Dependence on supplemental oxygen: Secondary | ICD-10-CM | POA: Diagnosis not present

## 2021-07-05 DIAGNOSIS — Z87891 Personal history of nicotine dependence: Secondary | ICD-10-CM | POA: Diagnosis not present

## 2021-07-06 ENCOUNTER — Telehealth: Payer: Self-pay | Admitting: Family Medicine

## 2021-07-06 NOTE — Telephone Encounter (Signed)
Appointment scheduled.

## 2021-07-08 ENCOUNTER — Ambulatory Visit (INDEPENDENT_AMBULATORY_CARE_PROVIDER_SITE_OTHER): Payer: Medicare Other | Admitting: Family

## 2021-07-08 ENCOUNTER — Encounter: Payer: Self-pay | Admitting: Family

## 2021-07-08 VITALS — BP 111/62 | HR 69 | Temp 97.7°F | Resp 97 | Ht 61.5 in | Wt 219.8 lb

## 2021-07-08 DIAGNOSIS — I10 Essential (primary) hypertension: Secondary | ICD-10-CM | POA: Diagnosis not present

## 2021-07-08 DIAGNOSIS — I350 Nonrheumatic aortic (valve) stenosis: Secondary | ICD-10-CM

## 2021-07-08 DIAGNOSIS — I209 Angina pectoris, unspecified: Secondary | ICD-10-CM

## 2021-07-08 DIAGNOSIS — Z09 Encounter for follow-up examination after completed treatment for conditions other than malignant neoplasm: Secondary | ICD-10-CM | POA: Diagnosis not present

## 2021-07-08 DIAGNOSIS — J449 Chronic obstructive pulmonary disease, unspecified: Secondary | ICD-10-CM | POA: Diagnosis not present

## 2021-07-08 DIAGNOSIS — I509 Heart failure, unspecified: Secondary | ICD-10-CM | POA: Diagnosis not present

## 2021-07-08 DIAGNOSIS — E782 Mixed hyperlipidemia: Secondary | ICD-10-CM

## 2021-07-08 MED ORDER — CARVEDILOL 3.125 MG PO TABS: ORAL_TABLET | ORAL | 1 refills | Status: DC

## 2021-07-08 MED ORDER — RAMIPRIL 5 MG PO CAPS: 5.0000 mg | ORAL_CAPSULE | Freq: Every day | ORAL | 1 refills | Status: DC

## 2021-07-08 NOTE — Patient Instructions (Signed)
Aortic Valve Stenosis Aortic valve stenosis is a narrowing of the aortic valve in the heart. The aortic valve opens and closes to regulate blood flow between the left side of the heart (left ventricle) and the artery that leads away from the heart (aorta). When the aortic valve becomes narrow, it is difficult for the heart to pump blood out to the body, which causes the heart to work harder. The extra work can weaken the heart muscle over time. Aortic valve stenosis can range from mild to severe. If it is not treated, it can become more severe over time and lead to heart failure. What are the causes? This condition may be caused by: Buildup of calcium around and on the aortic valve. This can occur with aging. This is the most common cause of aortic valve stenosis. A heart problem that developed in the womb (birth defect). Rheumatic fever. Radiation to the chest. What increases the risk? You may be more likely to develop this condition if: You are older than age 62. You were born with an abnormal bicuspid valve. What are the signs or symptoms? You may not have any symptoms until your condition becomes severe. It may take 10-20 years for mild or moderate aortic valve stenosis to become severe. Symptoms may include: Shortness of breath. This may get worse during physical activity. Feeling unusually weak and tired (fatigue). Extreme discomfort in the chest, neck, or arm during physical activity (angina). A heartbeat that is irregular or faster than normal (palpitations). Dizziness or fainting. This may happen when you get physically tired or after you take certain heart medicines, such as nitroglycerin. How is this diagnosed? This condition may be diagnosed with: A physical exam. Echocardiogram. This is a type of imaging test that uses sound waves (ultrasound) to make images of your heart. There are two kinds of this test that may be used. Transthoracic echocardiogram (TTE). For this type, a  wand-like tool (transducer) is moved over your chest to create ultrasound images that are recorded by a computer. Transesophageal echocardiogram (TEE). For this type, a flexible tube (probe) is inserted down the part of the body that moves food from your mouth to your stomach (esophagus). The heart and the esophagus are close to each other. Your health care provider will use the probe to take clear, detailed pictures of the heart. Cardiac catheterization. For this procedure, a small, thin tube (catheter) is passed through a large vein in your neck, groin, or arm. The catheter is used to get information about arteries, structures, blood pressure, and oxygen levels in your heart. Stress tests. These are tests that evaluate the blood supply to your heart and your heart's response to exercise. You may work with a health care provider who specializes in the heart (cardiologist) for diagnosis and treatment. How is this treated? Treatment depends on how severe your condition is and what your symptoms are. You will need to have your heart checked regularly to make sure that your condition is not getting worse or causing serious problems. Treatment may also include: Surgery to replace your aortic valve. This is the most common treatment for aortic valve stenosis, and it is the only treatment to cure the condition. Several types of surgeries are available. The surgery may be done: Through a large incision over your heart (open-heart surgery). Through small incisions, using a flexible tube called a catheter (transcatheter aortic valve replacement, TAVR). Medicines that help to keep your heart rate regular. Medicines that thin your blood (anticoagulants) to  prevent blood clots. Antibiotic medicines to help prevent infection. If your condition is mild, you may only need regular follow-up visits for monitoring. Follow these instructions at home: Lifestyle Limit alcohol intake to no more than 1 drink a day for  nonpregnant women and 2 drinks a day for men. One drink equals 12 oz of beer, 5 oz of wine, or 1 oz of hard liquor. Do not use any products that contain nicotine or tobacco, such as cigarettes and e-cigarettes. If you need help quitting, ask your health care provider. Work with your health care provider to manage your blood pressure and cholesterol. Maintain a healthy weight. Eating and drinking  Eat a heart-healthy diet that includes plenty of fresh fruits and vegetables, whole grains, lean protein, and low-fat or nonfat dairy. Limit how much caffeine you drink. Caffeine can affect your heart's rate and rhythm. Avoid foods that are: High in salt (sodium), saturated fat, or sugar. Canned or highly processed. Maceo Pro. Follow instructions from your health care provider about any other eating or drinking restrictions. Activity Exercise regularly and return to your normal activities as told by your health care provider. Ask your health care provider what amount and type of physical activity is safe for you. If your aortic valve stenosis is mild, you may only need to avoid very intense physical activity, such as heavy weight lifting. The more severe your aortic valve stenosis is, the more activities you may need to avoid. If you are taking blood thinners: Before you take any medicines that contain aspirin or NSAIDs, talk with your health care provider. These medicines increase your risk for dangerous bleeding. Take your medicine exactly as told, at the same time every day. Avoid activities that could cause injury or bruising, and follow instructions about how to prevent falls. Wear a medical alert bracelet or carry a card that lists what medicines you take. General instructions Take over-the-counter and prescription medicines only as told by your health care provider. If you were prescribed an antibiotic, take it as told by your health care provider. Do not stop taking the antibiotic even if you  start to feel better. If you are a woman and you plan to become pregnant, talk with your health care provider before you become pregnant. Before you have any type of medical or dental procedure or surgery, tell all health care providers that you have aortic valve stenosis. This may affect the treatment that you receive. Keep all follow-up visits as told by your health care provider. This is important. Contact a health care provider if: You have a fever. Get help right away if: You develop any of the following symptoms: Chest pain. Chest tightness. Shortness of breath. Trouble breathing. You feel light-headed. You feel like you might faint. Your heartbeat is irregular or faster than normal. These symptoms may represent a serious problem that is an emergency. Do not wait to see if the symptoms will go away. Get medical help right away. Call your local emergency services (911 in the [place] Do not drive yourself to the hospital. Summary Aortic valve stenosis is a narrowing of the aortic valve in the heart. The aortic valve opens and closes to regulate blood flow between the left side of the heart (left ventricle) and the artery that leads away from the heart (aorta). Aortic valve stenosis can range from mild to severe. If it is not treated, it can become more severe over time and lead to heart failure. Treatment depends on how severe your condition  is and what your symptoms are. You will need to have your heart checked regularly to make sure that your condition is not getting worse or causing serious problems. Exercise regularly and return to your normal activities as told by your health care provider. Ask your health care provider what amount and type of physical activity is safe for you. This information is not intended to replace advice given to you by your health care provider. Make sure you discuss any questions you have with your health care provider. Document Revised: 11/19/2020 Document  Reviewed: 12/04/2019 Elsevier Patient Education  2022 [employer].

## 2021-07-08 NOTE — Progress Notes (Signed)
Subjective:    Patient ID: Alexis Powell, female    DOB: 09/01/1940, 81 y.o.   MRN: [id number]  Chief Complaint  Patient presents with   Hospitalization Follow-up   Pt presents to the office today for hospital follow up. She went to the ED on 06/24/21 for chest pain. 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    While in the hospital she had a heart cath that was "fine".  She did have severe aortic stenosis and will need valve replacement in the future. She has a follow up with her Cardiologists next week.   She has intermittent chest pain. She has COPD and always has SOB. She is on 3 L. She has hyperlipemia and can not tolerate statins.   She has CHF and takes Lasix 80 mg daily. Denies any edema.   Chest Pain  This is a chronic problem. The onset quality is gradual. The pain is present in the substernal region. The pain is mild. The quality of the pain is described as dull. Associated symptoms include malaise/fatigue and shortness of breath.  Her past medical history is significant for hypertension.  Hypertension This is a chronic problem. The current episode started more than 1 year ago. The problem has been resolved since onset. The problem is controlled. Associated symptoms include chest pain, malaise/fatigue and shortness of breath. Pertinent negatives include no peripheral edema. Risk factors for coronary artery disease include obesity, dyslipidemia and sedentary lifestyle. The current treatment provides moderate improvement.     Review of Systems  Constitutional:  Positive for malaise/fatigue.  Respiratory:  Positive for shortness of breath.   Cardiovascular:  Positive for chest pain.  All other systems reviewed and are negative.     Objective:   Physical Exam Vitals reviewed.  Constitutional:      General: She is not in acute distress.    Appearance: She is well-developed. She is obese.  HENT:     Head: Normocephalic and atraumatic.     Right Ear: Tympanic  membrane normal.     Left Ear: Tympanic membrane normal.  Eyes:     Pupils: Pupils are equal, round, and reactive to light.  Neck:     Thyroid: No thyromegaly.  Cardiovascular:     Rate and Rhythm: Normal rate and regular rhythm.     Heart sounds: Murmur heard.  Pulmonary:     Effort: Pulmonary effort is normal. No respiratory distress.     Breath sounds: Normal breath sounds. No wheezing.     Comments: 3L of O2 Abdominal:     General: Bowel sounds are normal. There is no distension.     Palpations: Abdomen is soft.     Tenderness: There is no abdominal tenderness.  Musculoskeletal:        General: No tenderness. Normal range of motion.     Cervical back: Normal range of motion and neck supple.  Skin:    General: Skin is warm and dry.  Neurological:     Mental Status: She is alert and oriented to person, place, and time.     Cranial Nerves: No cranial nerve deficit.     Deep Tendon Reflexes: Reflexes are normal and symmetric.  Psychiatric:        Behavior: Behavior normal.        Thought Content: Thought content normal.        Judgment: Judgment normal.      BP 111/62    Pulse 69  Temp 97.7 F (36.5 C) (Temporal)    Resp (!) 97    Ht 5' 1.5" (1.562 m)    Wt 219 lb 12.8 oz (99.7 kg)    BMI 40.86 kg/m      Assessment & Plan:  Alexis Powell comes in today with chief complaint of Hospitalization Follow-up   Diagnosis and orders addressed:  1. Angina pectoris ([place]) - CMP14+EGFR - CBC with Differential/Platelet  2. Severe aortic stenosis - CMP14+EGFR - CBC with Differential/Platelet  3. COPD mixed type ([place]) - CMP14+EGFR - CBC with Differential/Platelet  4. Mixed hyperlipidemia - CMP14+EGFR - CBC with Differential/Platelet  5. Essential hypertension - ramipril (ALTACE) 5 MG capsule; Take 1 capsule (5 mg total) by mouth daily.  Dispense: 90 capsule; Refill: 1 - CMP14+EGFR - CBC with Differential/Platelet - carvedilol (COREG) 3.125 MG tablet; Take 3.125 mg  in AM and 6.25 mg in evening  Dispense: 270 tablet; Refill: 1  6. Hospital discharge follow-up - CMP14+EGFR - CBC with Differential/Platelet   7. Congenital heart failure ([place])  Labs pending Health Maintenance reviewed Diet and exercise encouraged  Follow up plan: With PCP    [name], FNP

## 2021-07-09 LAB — CBC WITH DIFFERENTIAL/PLATELET
Basophils Absolute: 0 10*3/uL (ref 0.0–0.2)
Basos: 1 %
EOS (ABSOLUTE): 0.1 10*3/uL (ref 0.0–0.4)
Eos: 2 %
Hematocrit: 37.7 % (ref 34.0–46.6)
Hemoglobin: 12.4 g/dL (ref 11.1–15.9)
Immature Grans (Abs): 0 10*3/uL (ref 0.0–0.1)
Immature Granulocytes: 0 %
Lymphocytes Absolute: 1.5 10*3/uL (ref 0.7–3.1)
Lymphs: 24 %
MCH: 32.8 pg (ref 26.6–33.0)
MCHC: 32.9 g/dL (ref 31.5–35.7)
MCV: 100 fL — ABNORMAL HIGH (ref 79–97)
Monocytes Absolute: 0.7 10*3/uL (ref 0.1–0.9)
Monocytes: 11 %
Neutrophils Absolute: 3.9 10*3/uL (ref 1.4–7.0)
Neutrophils: 62 %
Platelets: 149 10*3/uL — ABNORMAL LOW (ref 150–450)
RBC: 3.78 x10E6/uL (ref 3.77–5.28)
RDW: 11.1 % — ABNORMAL LOW (ref 11.7–15.4)
WBC: 6.2 10*3/uL (ref 3.4–10.8)

## 2021-07-09 LAB — CMP14+EGFR
ALT: 13 IU/L (ref 0–32)
AST: 18 IU/L (ref 0–40)
Albumin/Globulin Ratio: 1.9 (ref 1.2–2.2)
Albumin: 4.1 g/dL (ref 3.6–4.6)
Alkaline Phosphatase: 97 IU/L (ref 44–121)
BUN/Creatinine Ratio: 13 (ref 12–28)
BUN: 16 mg/dL (ref 8–27)
Bilirubin Total: 0.4 mg/dL (ref 0.0–1.2)
CO2: 33 mmol/L — ABNORMAL HIGH (ref 20–29)
Calcium: 8.9 mg/dL (ref 8.7–10.3)
Chloride: 96 mmol/L (ref 96–106)
Creatinine, Ser: 1.24 mg/dL — ABNORMAL HIGH (ref 0.57–1.00)
Globulin, Total: 2.2 g/dL (ref 1.5–4.5)
Glucose: 105 mg/dL — ABNORMAL HIGH (ref 70–99)
Potassium: 5.2 mmol/L (ref 3.5–5.2)
Sodium: 140 mmol/L (ref 134–144)
Total Protein: 6.3 g/dL (ref 6.0–8.5)
eGFR: 43 mL/min/{1.73_m2} — ABNORMAL LOW (ref >59)

## 2021-07-15 DIAGNOSIS — J449 Chronic obstructive pulmonary disease, unspecified: Secondary | ICD-10-CM | POA: Diagnosis not present

## 2021-07-15 DIAGNOSIS — Z951 Presence of aortocoronary bypass graft: Secondary | ICD-10-CM | POA: Diagnosis not present

## 2021-07-15 DIAGNOSIS — I5022 Chronic systolic (congestive) heart failure: Secondary | ICD-10-CM | POA: Diagnosis not present

## 2021-07-15 DIAGNOSIS — I350 Nonrheumatic aortic (valve) stenosis: Secondary | ICD-10-CM | POA: Diagnosis not present

## 2021-07-15 DIAGNOSIS — I428 Other cardiomyopathies: Secondary | ICD-10-CM | POA: Diagnosis not present

## 2021-07-16 ENCOUNTER — Other Ambulatory Visit: Payer: Self-pay | Admitting: Family Medicine

## 2021-07-19 ENCOUNTER — Telehealth: Payer: Self-pay | Admitting: Family Medicine

## 2021-07-19 DIAGNOSIS — I10 Essential (primary) hypertension: Secondary | ICD-10-CM

## 2021-07-19 MED ORDER — CARVEDILOL 3.125 MG PO TABS: ORAL_TABLET | ORAL | 1 refills | Status: AC

## 2021-07-19 MED ORDER — RAMIPRIL 5 MG PO CAPS: 5.0000 mg | ORAL_CAPSULE | Freq: Every day | ORAL | 1 refills | Status: AC

## 2021-07-19 NOTE — Telephone Encounter (Signed)
Prescription resent to pharmacy and note written wants brand name.   [name], FNP

## 2021-07-26 DIAGNOSIS — Z9049 Acquired absence of other specified parts of digestive tract: Secondary | ICD-10-CM | POA: Diagnosis not present

## 2021-07-26 DIAGNOSIS — Z951 Presence of aortocoronary bypass graft: Secondary | ICD-10-CM | POA: Diagnosis not present

## 2021-07-26 DIAGNOSIS — I350 Nonrheumatic aortic (valve) stenosis: Secondary | ICD-10-CM | POA: Diagnosis not present

## 2021-07-26 DIAGNOSIS — Z01810 Encounter for preprocedural cardiovascular examination: Secondary | ICD-10-CM | POA: Diagnosis not present

## 2021-07-26 DIAGNOSIS — N281 Cyst of kidney, acquired: Secondary | ICD-10-CM | POA: Diagnosis not present

## 2021-07-26 DIAGNOSIS — I2510 Atherosclerotic heart disease of native coronary artery without angina pectoris: Secondary | ICD-10-CM | POA: Diagnosis not present

## 2021-08-12 DIAGNOSIS — Z951 Presence of aortocoronary bypass graft: Secondary | ICD-10-CM | POA: Diagnosis not present

## 2021-08-12 DIAGNOSIS — I350 Nonrheumatic aortic (valve) stenosis: Secondary | ICD-10-CM | POA: Diagnosis not present

## 2021-08-12 DIAGNOSIS — I428 Other cardiomyopathies: Secondary | ICD-10-CM | POA: Diagnosis not present

## 2021-09-02 DIAGNOSIS — J441 Chronic obstructive pulmonary disease with (acute) exacerbation: Secondary | ICD-10-CM | POA: Diagnosis not present

## 2021-09-02 DIAGNOSIS — I350 Nonrheumatic aortic (valve) stenosis: Secondary | ICD-10-CM | POA: Diagnosis not present

## 2021-09-02 DIAGNOSIS — I428 Other cardiomyopathies: Secondary | ICD-10-CM | POA: Diagnosis not present

## 2021-09-02 DIAGNOSIS — R0902 Hypoxemia: Secondary | ICD-10-CM | POA: Diagnosis not present

## 2021-09-02 DIAGNOSIS — Z9981 Dependence on supplemental oxygen: Secondary | ICD-10-CM | POA: Diagnosis not present

## 2021-09-02 DIAGNOSIS — Z87891 Personal history of nicotine dependence: Secondary | ICD-10-CM | POA: Diagnosis not present

## 2021-09-10 DIAGNOSIS — Z01810 Encounter for preprocedural cardiovascular examination: Secondary | ICD-10-CM | POA: Diagnosis not present

## 2021-09-10 DIAGNOSIS — R918 Other nonspecific abnormal finding of lung field: Secondary | ICD-10-CM | POA: Diagnosis not present

## 2021-09-10 DIAGNOSIS — Z01818 Encounter for other preprocedural examination: Secondary | ICD-10-CM | POA: Diagnosis not present

## 2021-09-14 DIAGNOSIS — Z79899 Other long term (current) drug therapy: Secondary | ICD-10-CM | POA: Diagnosis not present

## 2021-09-14 DIAGNOSIS — I350 Nonrheumatic aortic (valve) stenosis: Secondary | ICD-10-CM | POA: Diagnosis not present

## 2021-09-14 DIAGNOSIS — I110 Hypertensive heart disease with heart failure: Secondary | ICD-10-CM | POA: Diagnosis not present

## 2021-09-14 DIAGNOSIS — Z9981 Dependence on supplemental oxygen: Secondary | ICD-10-CM | POA: Diagnosis not present

## 2021-09-14 DIAGNOSIS — Z6841 Body Mass Index (BMI) 40.0 and over, adult: Secondary | ICD-10-CM | POA: Diagnosis not present

## 2021-09-14 DIAGNOSIS — I428 Other cardiomyopathies: Secondary | ICD-10-CM | POA: Diagnosis not present

## 2021-09-14 DIAGNOSIS — I2510 Atherosclerotic heart disease of native coronary artery without angina pectoris: Secondary | ICD-10-CM | POA: Diagnosis not present

## 2021-09-14 DIAGNOSIS — I459 Conduction disorder, unspecified: Secondary | ICD-10-CM | POA: Diagnosis not present

## 2021-09-14 DIAGNOSIS — J90 Pleural effusion, not elsewhere classified: Secondary | ICD-10-CM | POA: Diagnosis not present

## 2021-09-14 DIAGNOSIS — R0989 Other specified symptoms and signs involving the circulatory and respiratory systems: Secondary | ICD-10-CM | POA: Diagnosis not present

## 2021-09-14 DIAGNOSIS — E785 Hyperlipidemia, unspecified: Secondary | ICD-10-CM | POA: Diagnosis not present

## 2021-09-14 DIAGNOSIS — I252 Old myocardial infarction: Secondary | ICD-10-CM | POA: Diagnosis not present

## 2021-09-14 DIAGNOSIS — I442 Atrioventricular block, complete: Secondary | ICD-10-CM | POA: Diagnosis not present

## 2021-09-14 DIAGNOSIS — Z953 Presence of xenogenic heart valve: Secondary | ICD-10-CM | POA: Diagnosis not present

## 2021-09-14 DIAGNOSIS — I517 Cardiomegaly: Secondary | ICD-10-CM | POA: Diagnosis not present

## 2021-09-14 DIAGNOSIS — I340 Nonrheumatic mitral (valve) insufficiency: Secondary | ICD-10-CM | POA: Diagnosis not present

## 2021-09-14 DIAGNOSIS — D696 Thrombocytopenia, unspecified: Secondary | ICD-10-CM | POA: Diagnosis not present

## 2021-09-14 DIAGNOSIS — I130 Hypertensive heart and chronic kidney disease with heart failure and stage 1 through stage 4 chronic kidney disease, or unspecified chronic kidney disease: Secondary | ICD-10-CM | POA: Diagnosis not present

## 2021-09-14 DIAGNOSIS — J849 Interstitial pulmonary disease, unspecified: Secondary | ICD-10-CM | POA: Diagnosis not present

## 2021-09-14 DIAGNOSIS — Z006 Encounter for examination for normal comparison and control in clinical research program: Secondary | ICD-10-CM | POA: Diagnosis not present

## 2021-09-14 DIAGNOSIS — Z7952 Long term (current) use of systemic steroids: Secondary | ICD-10-CM | POA: Diagnosis not present

## 2021-09-14 DIAGNOSIS — Z950 Presence of cardiac pacemaker: Secondary | ICD-10-CM | POA: Diagnosis not present

## 2021-09-14 DIAGNOSIS — H2513 Age-related nuclear cataract, bilateral: Secondary | ICD-10-CM | POA: Diagnosis not present

## 2021-09-14 DIAGNOSIS — Z954 Presence of other heart-valve replacement: Secondary | ICD-10-CM | POA: Diagnosis not present

## 2021-09-14 DIAGNOSIS — I10 Essential (primary) hypertension: Secondary | ICD-10-CM | POA: Diagnosis not present

## 2021-09-14 DIAGNOSIS — I25118 Atherosclerotic heart disease of native coronary artery with other forms of angina pectoris: Secondary | ICD-10-CM | POA: Diagnosis not present

## 2021-09-14 DIAGNOSIS — J984 Other disorders of lung: Secondary | ICD-10-CM | POA: Diagnosis not present

## 2021-09-14 DIAGNOSIS — J811 Chronic pulmonary edema: Secondary | ICD-10-CM | POA: Diagnosis not present

## 2021-09-14 DIAGNOSIS — K219 Gastro-esophageal reflux disease without esophagitis: Secondary | ICD-10-CM | POA: Diagnosis not present

## 2021-09-14 DIAGNOSIS — N189 Chronic kidney disease, unspecified: Secondary | ICD-10-CM | POA: Diagnosis not present

## 2021-09-14 DIAGNOSIS — J449 Chronic obstructive pulmonary disease, unspecified: Secondary | ICD-10-CM | POA: Diagnosis not present

## 2021-09-14 DIAGNOSIS — Z7951 Long term (current) use of inhaled steroids: Secondary | ICD-10-CM | POA: Diagnosis not present

## 2021-09-14 DIAGNOSIS — Z87891 Personal history of nicotine dependence: Secondary | ICD-10-CM | POA: Diagnosis not present

## 2021-09-14 DIAGNOSIS — I4519 Other right bundle-branch block: Secondary | ICD-10-CM | POA: Diagnosis not present

## 2021-09-14 DIAGNOSIS — Z7982 Long term (current) use of aspirin: Secondary | ICD-10-CM | POA: Diagnosis not present

## 2021-09-14 DIAGNOSIS — I5020 Unspecified systolic (congestive) heart failure: Secondary | ICD-10-CM | POA: Diagnosis not present

## 2021-09-14 DIAGNOSIS — I5022 Chronic systolic (congestive) heart failure: Secondary | ICD-10-CM | POA: Diagnosis not present

## 2021-09-14 DIAGNOSIS — R918 Other nonspecific abnormal finding of lung field: Secondary | ICD-10-CM | POA: Diagnosis not present

## 2021-09-14 DIAGNOSIS — R0902 Hypoxemia: Secondary | ICD-10-CM | POA: Diagnosis not present

## 2021-09-16 DIAGNOSIS — Z953 Presence of xenogenic heart valve: Secondary | ICD-10-CM | POA: Insufficient documentation

## 2021-09-16 DIAGNOSIS — Z950 Presence of cardiac pacemaker: Secondary | ICD-10-CM | POA: Insufficient documentation

## 2021-09-22 DIAGNOSIS — R0902 Hypoxemia: Secondary | ICD-10-CM | POA: Diagnosis not present

## 2021-09-22 DIAGNOSIS — Z950 Presence of cardiac pacemaker: Secondary | ICD-10-CM | POA: Diagnosis not present

## 2021-09-22 DIAGNOSIS — I428 Other cardiomyopathies: Secondary | ICD-10-CM | POA: Diagnosis not present

## 2021-09-22 DIAGNOSIS — I350 Nonrheumatic aortic (valve) stenosis: Secondary | ICD-10-CM | POA: Diagnosis not present

## 2021-09-22 DIAGNOSIS — J441 Chronic obstructive pulmonary disease with (acute) exacerbation: Secondary | ICD-10-CM | POA: Diagnosis not present

## 2021-09-22 DIAGNOSIS — Z951 Presence of aortocoronary bypass graft: Secondary | ICD-10-CM | POA: Diagnosis not present

## 2021-09-22 DIAGNOSIS — I2510 Atherosclerotic heart disease of native coronary artery without angina pectoris: Secondary | ICD-10-CM | POA: Diagnosis not present

## 2021-09-22 DIAGNOSIS — Z953 Presence of xenogenic heart valve: Secondary | ICD-10-CM | POA: Diagnosis not present

## 2021-09-30 NOTE — Progress Notes (Signed)
car

## 2021-10-01 DIAGNOSIS — I350 Nonrheumatic aortic (valve) stenosis: Secondary | ICD-10-CM | POA: Diagnosis not present

## 2021-10-07 ENCOUNTER — Ambulatory Visit (INDEPENDENT_AMBULATORY_CARE_PROVIDER_SITE_OTHER): Payer: Medicare Other

## 2021-10-07 VITALS — Wt 207.0 lb

## 2021-10-07 DIAGNOSIS — Z0000 Encounter for general adult medical examination without abnormal findings: Secondary | ICD-10-CM

## 2021-10-07 NOTE — Progress Notes (Signed)
? ?Subjective:  ? Alexis Powell is a 81 y.o. female who presents for Medicare Annual (Subsequent) preventive examination. ? ?Virtual Visit via Telephone Note ? ?I connected with  Alexis Powell on 10/07/21 at  2:45 PM EDT by telephone and verified that I am speaking with the correct person using two identifiers. ? ?Location: ?Patient: Home ?Provider: [facility] ?Persons participating in the virtual visit: patient/Nurse Health Advisor ?  ?I discussed the limitations, risks, security and privacy concerns of performing an evaluation and management service by telephone and the availability of in person appointments. The patient expressed understanding and agreed to proceed. ? ?Interactive audio and video telecommunications were attempted between this nurse and patient, however failed, due to patient having technical difficulties OR patient did not have access to video capability.  We continued and completed visit with audio only. ? ?Some vital signs may be absent or patient reported.  ? ?Alexis Villamar Dionne Ano, LPN  ? ?Review of Systems    ? ?Cardiac Risk Factors include: advanced age (>69mn, >>33women);sedentary lifestyle;obesity (BMI >30kg/m2);dyslipidemia;hypertension;Other (see comment), Risk factor comments: CHF atherosclerosis, congestive cardiomyopathy, S/P TAVR, S/P cardiac pacemaker placement, COPD ? ?   ?Objective:  ?  ?Today's Vitals  ? 10/07/21 1409  ?Weight: 207 lb (93.9 kg)  ? ?Body mass index is 38.48 kg/m?. ? ? ?  10/07/2021  ?  2:24 PM 09/28/2020  ? 10:44 AM 12/28/2018  ?  8:44 AM  ?Advanced Directives  ?Does Patient Have a Medical Advance Directive? No No No  ?Would patient like information on creating a medical advance directive? No - Patient declined No - Patient declined Yes (MAU/Ambulatory/Procedural Areas - Information given)  ? ? ?Current Medications (verified) ?Outpatient Encounter Medications as of 10/07/2021  ?Medication Sig  ? acetaminophen-codeine (TYLENOL #3) 300-30 MG tablet Take 1 tablet by mouth every 6  (six) hours as needed for moderate pain.  ? albuterol (PROAIR HFA) 108 (90 Base) MCG/ACT inhaler Inhale 2 puffs into the lungs every 4 (four) hours as needed for wheezing or shortness of breath.  ? aspirin 81 MG chewable tablet Chew 81 mg by mouth daily.  ? carvedilol (COREG) 3.125 MG tablet Take 3.125 mg in AM and 6.25 mg in evening  ? diphenhydrAMINE (BENADRYL ALLERGY) 25 MG tablet Take 1 tablet (25 mg total) by mouth at bedtime as needed for allergies.  ? famotidine (PEPCID) 20 MG tablet TAKE 1 TABLET(20 MG) BY MOUTH TWICE DAILY  ? furosemide (LASIX) 40 MG tablet Take 2 tablets (80 mg total) by mouth daily.  ? isosorbide mononitrate (IMDUR) 30 MG 24 hr tablet Take 1 tablet (30 mg total) by mouth daily. (Needs to be seen before next refill)  ? potassium chloride SA (KLOR-CON) 20 MEQ tablet Take 2 tablets (40 mEq total) by mouth daily.  ? ramipril (ALTACE) 5 MG capsule Take 1 capsule (5 mg total) by mouth daily.  ? TRELEGY ELLIPTA 100-62.5-25 MCG/INH AEPB   ? nitroGLYCERIN (NITROSTAT) 0.4 MG SL tablet SMARTSIG:1 Tablet(s) Sublingual  ? [DISCONTINUED] metoprolol succinate (TOPROL-XL) 25 MG 24 hr tablet Take 12.5 mg by mouth daily. (Patient not taking: Reported on 10/07/2021)  ? ?No facility-administered encounter medications on file as of 10/07/2021.  ? ? ?Allergies (verified) ?Meperidine hcl, Morphine and related, Penicillins, Phenol, Ramipril, Sulfa antibiotics, and Iodine-131  ? ?History: ?Past Medical History:  ?Diagnosis Date  ? CHF (congestive heart failure) ([facility]   ? COPD (chronic obstructive pulmonary disease) ([facility]   ? Hyperlipidemia   ? Hypertension   ? Myocardial  infarction [facility])   ? ?Past Surgical History:  ?Procedure Laterality Date  ? ABDOMINAL HYSTERECTOMY    ? APPENDECTOMY    ? CESAREAN SECTION    ? CHOLECYSTECTOMY    ? CORONARY ARTERY BYPASS GRAFT    ? EYE SURGERY Bilateral   ? cataracts  ? ?Family History  ?Problem Relation Age of Onset  ? Heart disease Mother   ? Heart disease Father   ? ?Social  History  ? ?Socioeconomic History  ? Marital status: Divorced  ?  Spouse name: Not on file  ? Number of children: 4  ? Years of education: Not on file  ? Highest education level: Associate degree: occupational, Hotel manager, or vocational program  ?Occupational History  ? Occupation: retired  ?Tobacco Use  ? Smoking status: Former  ?  Types: Cigarettes  ?  Quit date: 03/20/1999  ?  Years since quitting: 22.5  ? Smokeless tobacco: Never  ?Substance and Sexual Activity  ? Alcohol use: No  ?  Alcohol/week: 0.0 standard drinks  ? Drug use: No  ? Sexual activity: Not Currently  ?Other Topics Concern  ? Not on file  ?Social History Narrative  ? Lives alone on one level - her daughters live nearby and help her when needed  ? ?Social Determinants of Health  ? ?Financial Resource Strain: Low Risk   ? Difficulty of Paying Living Expenses: Not hard at all  ?Food Insecurity: No Food Insecurity  ? Worried About Charity fundraiser in the Last Year: Never true  ? Ran Out of Food in the Last Year: Never true  ?Transportation Needs: No Transportation Needs  ? Lack of Transportation (Medical): No  ? Lack of Transportation (Non-Medical): No  ?Physical Activity: Insufficiently Active  ? Days of Exercise per Week: 7 days  ? Minutes of Exercise per Session: 10 min  ?Stress: No Stress Concern Present  ? Feeling of Stress : Not at all  ?Social Connections: Moderately Integrated  ? Frequency of Communication with Friends and Family: More than three times a week  ? Frequency of Social Gatherings with Friends and Family: More than three times a week  ? Attends Religious Services: 1 to 4 times per year  ? Active Member of Clubs or Organizations: Yes  ? Attends Archivist Meetings: 1 to 4 times per year  ? Marital Status: Divorced  ? ? ?Tobacco Counseling ?Counseling given: Not Answered ? ? ?Clinical Intake: ? ?Pre-visit preparation completed: Yes ? ?Pain : No/denies pain ? ?  ? ?BMI - recorded: 38.48 ?Nutritional Status: BMI > 30   Obese ?Nutritional Risks: None ?Diabetes: No ? ?How often do you need to have someone help you when you read instructions, pamphlets, or other written materials from your doctor or pharmacy?: 1 - Never ? ?Diabetic? no ? ?Interpreter Needed?: No ? ?Information entered by :: Tyerra Loretto, LPN ? ? ?Activities of Daily Living ? ?  10/07/2021  ?  2:21 PM  ?In your present state of health, do you have any difficulty performing the following activities:  ?Hearing? 0  ?Vision? 0  ?Difficulty concentrating or making decisions? 0  ?Walking or climbing stairs? 1  ?Dressing or bathing? 0  ?Doing errands, shopping? 1  ?Comment daughters drive her. She stopped driving when health declined 6 months ago - plans to start back  ?Preparing Food and eating ? N  ?Using the Toilet? N  ?In the past six months, have you accidently leaked urine? N  ?Do you have  problems with loss of bowel control? N  ?Managing your Medications? N  ?Managing your Finances? N  ?Housekeeping or managing your Housekeeping? Y  ?Comment SOB  ? ? ?Patient Care Team: ?Claretta Fraise, MD as PCP - General (Family Medicine) ?Nani Gasser (Inactive) as Referring Physician (Pulmonary Disease) ?Means, Lolita Cram, MD as Referring Physician (Cardiology) ?Jackelyn Knife, MD as Referring Physician (Cardiothoracic Surgery) ? ?Indicate any recent Medical Services you may have received from other than [facility] providers in the past year (date may be approximate). ? ?   ?Assessment:  ? This is a routine wellness examination for Fionna. ? ?Hearing/Vision screen ?Hearing Screening - Comments:: Denies hearing difficulties   ?Vision Screening - Comments:: Wears reading glasses prn - behind on annual eye exams with Dr Domenic Polite ? ?Dietary issues and exercise activities discussed: ?Current Exercise Habits: Home exercise routine, Type of exercise: walking, Time (Minutes): 10, Frequency (Times/Week): 7, Weekly Exercise (Minutes/Week): 70, Intensity: Mild, Exercise limited by: cardiac  condition(s);respiratory conditions(s) ? ? Goals Addressed   ? ?  ?  ?  ?  ? This Visit's Progress  ?  DIET - EAT MORE FRUITS AND VEGETABLES   On track  ?  Patient Stated     ?  Get more independent, get out more and mo

## 2021-10-07 NOTE — Patient Instructions (Signed)
Alexis Powell , ?Thank you for taking time to come for your Medicare Wellness Visit. I appreciate your ongoing commitment to your health goals. Please review the following plan we discussed and let me know if I can assist you in the future.  ? ?Screening recommendations/referrals: ?Colonoscopy: No longer required ?Mammogram: No longer required - declined ?Bone Density: No longer required - declined ?Recommended yearly ophthalmology/optometry visit for glaucoma screening and checkup ?Recommended yearly dental visit for hygiene and checkup ? ?Vaccinations: ?Influenza vaccine: Declined - recommended every fall ?Pneumococcal vaccine: Done 05/04/2013 - due for Prevnar ?Tdap vaccine: Done 2-3 years ago - we need to find dates ?Shingles vaccine: Declined - Shingrix is 2 doses 2-6 months apart and over 90% effective     ?Covid-19:Done  08/27/2019, 09/19/2019, & 04/03/2020 ? ?Advanced directives: Advance directive discussed with you today. Even though you declined this today, please call our office should you change your mind, and we can give you the proper paperwork for you to fill out.  ? ?Conditions/risks identified: Aim for 30 minutes of exercise or brisk walking, 6-8 glasses of water, and 5 servings of fruits and vegetables each day.  ? ?Next appointment: Follow up in one year for your annual wellness visit  ? ? ?Preventive Care 34 Years and Older, Female ?Preventive care refers to lifestyle choices and visits with your health care provider that can promote health and wellness. ?What does preventive care include? ?A yearly physical exam. This is also called an annual well check. ?Dental exams once or twice a year. ?Routine eye exams. Ask your health care provider how often you should have your eyes checked. ?Personal lifestyle choices, including: ?Daily care of your teeth and gums. ?Regular physical activity. ?Eating a healthy diet. ?Avoiding tobacco and drug use. ?Limiting alcohol use. ?Practicing safe sex. ?Taking  low-dose aspirin every day. ?Taking vitamin and mineral supplements as recommended by your health care provider. ?What happens during an annual well check? ?The services and screenings done by your health care provider during your annual well check will depend on your age, overall health, lifestyle risk factors, and family history of disease. ?Counseling  ?Your health care provider may ask you questions about your: ?Alcohol use. ?Tobacco use. ?Drug use. ?Emotional well-being. ?Home and relationship well-being. ?Sexual activity. ?Eating habits. ?History of falls. ?Memory and ability to understand (cognition). ?Work and work Statistician. ?Reproductive health. ?Screening  ?You may have the following tests or measurements: ?Height, weight, and BMI. ?Blood pressure. ?Lipid and cholesterol levels. These may be checked every 5 years, or more frequently if you are over 32 years old. ?Skin check. ?Lung cancer screening. You may have this screening every year starting at age 21 if you have a 30-pack-year history of smoking and currently smoke or have quit within the past 15 years. ?Fecal occult blood test (FOBT) of the stool. You may have this test every year starting at age 30. ?Flexible sigmoidoscopy or colonoscopy. You may have a sigmoidoscopy every 5 years or a colonoscopy every 10 years starting at age 61. ?Hepatitis C blood test. ?Hepatitis B blood test. ?Sexually transmitted disease (STD) testing. ?Diabetes screening. This is done by checking your blood sugar (glucose) after you have not eaten for a while (fasting). You may have this done every 1-3 years. ?Bone density scan. This is done to screen for osteoporosis. You may have this done starting at age 22. ?Mammogram. This may be done every 1-2 years. Talk to your health care provider about how often you should  have regular mammograms. ?Talk with your health care provider about your test results, treatment options, and if necessary, the need for more tests. ?Vaccines   ?Your health care provider may recommend certain vaccines, such as: ?Influenza vaccine. This is recommended every year. ?Tetanus, diphtheria, and acellular pertussis (Tdap, Td) vaccine. You may need a Td booster every 10 years. ?Zoster vaccine. You may need this after age 32. ?Pneumococcal 13-valent conjugate (PCV13) vaccine. One dose is recommended after age 1. ?Pneumococcal polysaccharide (PPSV23) vaccine. One dose is recommended after age 48. ?Talk to your health care provider about which screenings and vaccines you need and how often you need them. ?This information is not intended to replace advice given to you by your health care provider. Make sure you discuss any questions you have with your health care provider. ?Document Released: 07/17/2015 Document Revised: 03/09/2016 Document Reviewed: 04/21/2015 ?Elsevier Interactive Patient Education ? 2017 [employer]. ? ?Fall Prevention in the Home ?Falls can cause injuries. They can happen to people of all ages. There are many things you can do to make your home safe and to help prevent falls. ?What can I do on the outside of my home? ?Regularly fix the edges of walkways and driveways and fix any cracks. ?Remove anything that might make you trip as you walk through a door, such as a raised step or threshold. ?Trim any bushes or trees on the path to your home. ?Use bright outdoor lighting. ?Clear any walking paths of anything that might make someone trip, such as rocks or tools. ?Regularly check to see if handrails are loose or broken. Make sure that both sides of any steps have handrails. ?Any raised decks and porches should have guardrails on the edges. ?Have any leaves, snow, or ice cleared regularly. ?Use sand or salt on walking paths during winter. ?Clean up any spills in your garage right away. This includes oil or grease spills. ?What can I do in the bathroom? ?Use night lights. ?Install grab bars by the toilet and in the tub and shower. Do not use towel  bars as grab bars. ?Use non-skid mats or decals in the tub or shower. ?If you need to sit down in the shower, use a plastic, non-slip stool. ?Keep the floor dry. Clean up any water that spills on the floor as soon as it happens. ?Remove soap buildup in the tub or shower regularly. ?Attach bath mats securely with double-sided non-slip rug tape. ?Do not have throw rugs and other things on the floor that can make you trip. ?What can I do in the bedroom? ?Use night lights. ?Make sure that you have a light by your bed that is easy to reach. ?Do not use any sheets or blankets that are too big for your bed. They should not hang down onto the floor. ?Have a firm chair that has side arms. You can use this for support while you get dressed. ?Do not have throw rugs and other things on the floor that can make you trip. ?What can I do in the kitchen? ?Clean up any spills right away. ?Avoid walking on wet floors. ?Keep items that you use a lot in easy-to-reach places. ?If you need to reach something above you, use a strong step stool that has a grab bar. ?Keep electrical cords out of the way. ?Do not use floor polish or wax that makes floors slippery. If you must use wax, use non-skid floor wax. ?Do not have throw rugs and other things on the floor  that can make you trip. ?What can I do with my stairs? ?Do not leave any items on the stairs. ?Make sure that there are handrails on both sides of the stairs and use them. Fix handrails that are broken or loose. Make sure that handrails are as long as the stairways. ?Check any carpeting to make sure that it is firmly attached to the stairs. Fix any carpet that is loose or worn. ?Avoid having throw rugs at the top or bottom of the stairs. If you do have throw rugs, attach them to the floor with carpet tape. ?Make sure that you have a light switch at the top of the stairs and the bottom of the stairs. If you do not have them, ask someone to add them for you. ?What else can I do to help  prevent falls? ?Wear shoes that: ?Do not have high heels. ?Have rubber bottoms. ?Are comfortable and fit you well. ?Are closed at the toe. Do not wear sandals. ?If you use a stepladder: ?Make sure that it is f

## 2021-10-12 DIAGNOSIS — Z950 Presence of cardiac pacemaker: Secondary | ICD-10-CM | POA: Diagnosis not present

## 2021-10-12 DIAGNOSIS — E7800 Pure hypercholesterolemia, unspecified: Secondary | ICD-10-CM | POA: Diagnosis not present

## 2021-10-12 DIAGNOSIS — J449 Chronic obstructive pulmonary disease, unspecified: Secondary | ICD-10-CM | POA: Diagnosis not present

## 2021-10-12 DIAGNOSIS — I2510 Atherosclerotic heart disease of native coronary artery without angina pectoris: Secondary | ICD-10-CM | POA: Diagnosis not present

## 2021-10-12 DIAGNOSIS — J9611 Chronic respiratory failure with hypoxia: Secondary | ICD-10-CM | POA: Diagnosis not present

## 2021-10-12 DIAGNOSIS — I10 Essential (primary) hypertension: Secondary | ICD-10-CM | POA: Diagnosis not present

## 2021-10-12 DIAGNOSIS — Z6841 Body Mass Index (BMI) 40.0 and over, adult: Secondary | ICD-10-CM | POA: Diagnosis not present

## 2021-10-12 DIAGNOSIS — Z951 Presence of aortocoronary bypass graft: Secondary | ICD-10-CM | POA: Diagnosis not present

## 2021-10-12 DIAGNOSIS — I442 Atrioventricular block, complete: Secondary | ICD-10-CM | POA: Diagnosis not present

## 2021-10-12 DIAGNOSIS — Z953 Presence of xenogenic heart valve: Secondary | ICD-10-CM | POA: Diagnosis not present

## 2021-10-12 DIAGNOSIS — Z0000 Encounter for general adult medical examination without abnormal findings: Secondary | ICD-10-CM | POA: Diagnosis not present

## 2021-10-13 ENCOUNTER — Telehealth: Payer: Self-pay | Admitting: Family Medicine

## 2021-10-14 DIAGNOSIS — Z953 Presence of xenogenic heart valve: Secondary | ICD-10-CM | POA: Diagnosis not present

## 2021-10-21 DIAGNOSIS — Z952 Presence of prosthetic heart valve: Secondary | ICD-10-CM | POA: Diagnosis not present

## 2021-10-21 DIAGNOSIS — Z950 Presence of cardiac pacemaker: Secondary | ICD-10-CM | POA: Diagnosis not present

## 2021-10-21 DIAGNOSIS — I428 Other cardiomyopathies: Secondary | ICD-10-CM | POA: Diagnosis not present

## 2021-10-21 DIAGNOSIS — Z951 Presence of aortocoronary bypass graft: Secondary | ICD-10-CM | POA: Diagnosis not present

## 2021-10-21 DIAGNOSIS — Z953 Presence of xenogenic heart valve: Secondary | ICD-10-CM | POA: Diagnosis not present

## 2021-10-28 DIAGNOSIS — I442 Atrioventricular block, complete: Secondary | ICD-10-CM | POA: Diagnosis not present

## 2021-10-29 DIAGNOSIS — J9611 Chronic respiratory failure with hypoxia: Secondary | ICD-10-CM | POA: Diagnosis not present

## 2021-10-29 DIAGNOSIS — I428 Other cardiomyopathies: Secondary | ICD-10-CM | POA: Diagnosis not present

## 2021-12-30 DIAGNOSIS — I10 Essential (primary) hypertension: Secondary | ICD-10-CM | POA: Diagnosis not present

## 2021-12-30 DIAGNOSIS — I2510 Atherosclerotic heart disease of native coronary artery without angina pectoris: Secondary | ICD-10-CM | POA: Diagnosis not present

## 2021-12-30 DIAGNOSIS — Z953 Presence of xenogenic heart valve: Secondary | ICD-10-CM | POA: Diagnosis not present

## 2021-12-30 DIAGNOSIS — Z950 Presence of cardiac pacemaker: Secondary | ICD-10-CM | POA: Diagnosis not present

## 2022-01-31 DIAGNOSIS — I428 Other cardiomyopathies: Secondary | ICD-10-CM | POA: Diagnosis not present

## 2022-01-31 DIAGNOSIS — N1832 Chronic kidney disease, stage 3b: Secondary | ICD-10-CM | POA: Diagnosis not present

## 2022-01-31 DIAGNOSIS — I10 Essential (primary) hypertension: Secondary | ICD-10-CM | POA: Diagnosis not present

## 2022-01-31 DIAGNOSIS — J9611 Chronic respiratory failure with hypoxia: Secondary | ICD-10-CM | POA: Diagnosis not present

## 2022-01-31 DIAGNOSIS — D696 Thrombocytopenia, unspecified: Secondary | ICD-10-CM | POA: Diagnosis not present

## 2022-01-31 DIAGNOSIS — J449 Chronic obstructive pulmonary disease, unspecified: Secondary | ICD-10-CM | POA: Diagnosis not present

## 2022-01-31 DIAGNOSIS — Z23 Encounter for immunization: Secondary | ICD-10-CM | POA: Diagnosis not present

## 2022-01-31 DIAGNOSIS — Z953 Presence of xenogenic heart valve: Secondary | ICD-10-CM | POA: Diagnosis not present

## 2022-01-31 DIAGNOSIS — E7800 Pure hypercholesterolemia, unspecified: Secondary | ICD-10-CM | POA: Diagnosis not present

## 2022-02-02 DIAGNOSIS — I442 Atrioventricular block, complete: Secondary | ICD-10-CM | POA: Diagnosis not present

## 2022-04-07 DIAGNOSIS — I442 Atrioventricular block, complete: Secondary | ICD-10-CM | POA: Diagnosis not present

## 2022-04-07 DIAGNOSIS — Z951 Presence of aortocoronary bypass graft: Secondary | ICD-10-CM | POA: Diagnosis not present

## 2022-04-07 DIAGNOSIS — Z952 Presence of prosthetic heart valve: Secondary | ICD-10-CM | POA: Diagnosis not present

## 2022-04-07 DIAGNOSIS — I428 Other cardiomyopathies: Secondary | ICD-10-CM | POA: Diagnosis not present

## 2022-04-18 ENCOUNTER — Encounter: Payer: Self-pay | Admitting: *Deleted

## 2022-04-18 ENCOUNTER — Ambulatory Visit: Payer: Self-pay | Admitting: *Deleted

## 2022-04-18 NOTE — Patient Instructions (Signed)
Visit Information  Thank you for taking time to visit with me today. Please don't hesitate to contact me if I can be of assistance to you.   Please call the care guide team at [phone] if you need to cancel or reschedule your appointment.   If you are experiencing a Mental Health or Behavioral Health Crisis or need someone to talk to, please call the Suicide and Crisis Lifeline: 988 call the [place] National Suicide Prevention Lifeline: 1-800-273-8255 or TTY: 1-800-799-4 TTY (1-800-799-4889) to talk to a trained counselor call 1-800-273-TALK (toll free, 24 hour hotline) go to [facility] [address], [place] ([phone]) call the [place] Crisis Line: 800-939-9988 call 911  Patient verbalizes understanding of instructions and care plan provided today and agrees to view in MyChart. Active MyChart status and patient understanding of how to access instructions and care plan via MyChart confirmed with patient.     No further follow up required.  Axle Parfait, BSW, MSW, LCSW  Licensed Clinical Social Worker  [facility]  Mailing Address-[address], [place], [place] [address] Physical Address-[address], [place], [place] [address] Toll Free Main # [phone] Fax # [phone] Cell # [phone] [email or web address]            

## 2022-04-18 NOTE — Patient Outreach (Signed)
  Care Coordination   Initial Visit Note   04/18/2022  Name: Alexis Powell MRN: [id number] DOB: August 20, 1940  Alexis Powell is a 81 y.o. year old female who sees Dr. Toney Reil for primary care. I spoke with Alexis Powell by phone today.  What matters to the patients health and wellness today?  No Interventions Identified.   SDOH assessments and interventions completed:  Yes.  SDOH Interventions Today    Flowsheet Row Most Recent Value  SDOH Interventions   Food Insecurity Interventions Intervention Not Indicated  Housing Interventions Intervention Not Indicated  Transportation Interventions Intervention Not Indicated  Utilities Interventions Intervention Not Indicated  Alcohol Usage Interventions Intervention Not Indicated (Score <7)  Financial Strain Interventions Intervention Not Indicated  Physical Activity Interventions Intervention Not Indicated  Stress Interventions Intervention Not Indicated  Social Connections Interventions Intervention Not Indicated         Care Coordination Interventions Activated:  Yes.    Care Coordination Interventions:  Yes, provided.    Follow up plan: No further intervention required.    Encounter Outcome:  Pt. Visit Completed.    Nat Christen, BSW, MSW, LCSW  Licensed Education officer, [facility]  Mailing [place] [address] [place], [place] [address] Physical Address-[address], [place], [place] [address] Toll Free Main # [phone] Fax # [phone] Cell # [phone] [email or web address]

## 2022-05-11 DIAGNOSIS — I442 Atrioventricular block, complete: Secondary | ICD-10-CM | POA: Diagnosis not present

## 2022-06-30 DIAGNOSIS — J441 Chronic obstructive pulmonary disease with (acute) exacerbation: Secondary | ICD-10-CM | POA: Diagnosis not present

## 2022-06-30 DIAGNOSIS — B349 Viral infection, unspecified: Secondary | ICD-10-CM | POA: Diagnosis not present

## 2022-06-30 DIAGNOSIS — J9611 Chronic respiratory failure with hypoxia: Secondary | ICD-10-CM | POA: Diagnosis not present

## 2022-11-02 DEATH — deceased
